# Patient Record
Sex: Female | Born: 1960 | Race: Black or African American | Hispanic: No | Smoking: Current every day smoker
Health system: Southern US, Community
[De-identification: ages and names within clinical notes are randomized; demographics above are authoritative.]

## PROBLEM LIST (undated history)

## (undated) DIAGNOSIS — N939 Abnormal uterine and vaginal bleeding, unspecified: Secondary | ICD-10-CM

## (undated) DIAGNOSIS — M5432 Sciatica, left side: Secondary | ICD-10-CM

## (undated) DIAGNOSIS — M1711 Unilateral primary osteoarthritis, right knee: Secondary | ICD-10-CM

## (undated) DIAGNOSIS — Z9989 Dependence on other enabling machines and devices: Secondary | ICD-10-CM

## (undated) DIAGNOSIS — E119 Type 2 diabetes mellitus without complications: Secondary | ICD-10-CM

## (undated) DIAGNOSIS — M549 Dorsalgia, unspecified: Secondary | ICD-10-CM

## (undated) DIAGNOSIS — E785 Hyperlipidemia, unspecified: Secondary | ICD-10-CM

## (undated) DIAGNOSIS — Z973 Presence of spectacles and contact lenses: Secondary | ICD-10-CM

## (undated) DIAGNOSIS — R569 Unspecified convulsions: Secondary | ICD-10-CM

## (undated) DIAGNOSIS — H43811 Vitreous degeneration, right eye: Secondary | ICD-10-CM

## (undated) DIAGNOSIS — D649 Anemia, unspecified: Secondary | ICD-10-CM

## (undated) DIAGNOSIS — Z972 Presence of dental prosthetic device (complete) (partial): Secondary | ICD-10-CM

## (undated) DIAGNOSIS — M5430 Sciatica, unspecified side: Secondary | ICD-10-CM

## (undated) DIAGNOSIS — G8929 Other chronic pain: Secondary | ICD-10-CM

## (undated) DIAGNOSIS — E669 Obesity, unspecified: Secondary | ICD-10-CM

## (undated) HISTORY — PX: ABDOMINAL HYSTERECTOMY: SHX81

## (undated) HISTORY — DX: Dorsalgia, unspecified: M54.9

## (undated) HISTORY — PX: COLONOSCOPY WITH ESOPHAGOGASTRODUODENOSCOPY (EGD): SHX5779

## (undated) HISTORY — PX: TRIGGER FINGER RELEASE: SHX641

## (undated) HISTORY — DX: Sciatica, unspecified side: M54.30

## (undated) HISTORY — DX: Other chronic pain: G89.29

## (undated) HISTORY — DX: Obesity, unspecified: E66.9

---

## 2018-10-23 ENCOUNTER — Encounter (HOSPITAL_COMMUNITY): Payer: Self-pay | Admitting: Emergency Medicine

## 2018-10-23 ENCOUNTER — Emergency Department (HOSPITAL_COMMUNITY)
Admission: EM | Admit: 2018-10-23 | Discharge: 2018-10-23 | Disposition: A | Discharge status: Home / Self Care | Payer: Medicare Other | Attending: Emergency Medicine | Admitting: Emergency Medicine

## 2018-10-23 DIAGNOSIS — M5442 Lumbago with sciatica, left side: Secondary | ICD-10-CM | POA: Diagnosis not present

## 2018-10-23 DIAGNOSIS — G8929 Other chronic pain: Secondary | ICD-10-CM

## 2018-10-23 DIAGNOSIS — M545 Low back pain: Secondary | ICD-10-CM | POA: Diagnosis present

## 2018-10-23 MED ORDER — PREDNISONE 10 MG (21) PO TBPK: ORAL_TABLET | Freq: Every day | ORAL | 0 refills | Status: DC

## 2018-10-23 MED ORDER — NAPROXEN 500 MG PO TABS: 500.0000 mg | ORAL_TABLET | Freq: Two times a day (BID) | ORAL | 0 refills | Status: DC

## 2018-10-23 MED ORDER — KETOROLAC TROMETHAMINE 30 MG/ML IJ SOLN
60.0000 mg | Freq: Once | INTRAMUSCULAR | Status: AC
  Administered 2018-10-23: 60 mg via INTRAMUSCULAR
  Filled 2018-10-23: qty 2

## 2018-10-23 NOTE — Discharge Instructions (Addendum)
Back Pain:  I have prescribed you an anti-inflammatory medication and a steroid to reduce inflammation.    Prednisone Instructions: Take 6 tabs by mouth daily for 2 days, then 5 tabs for 2 days, then 4 tabs for 2 days, then 3 tabs for 2 days, 2 tabs for 2 days, then 1 tab by mouth daily for 2 days.  Naproxen is a nonsteroidal anti-inflammatory medication that will help with pain and swelling. Be sure to take this medication as prescribed with food, 1 pill every 12 hours,  It should be taken with food, as it can cause stomach upset, and more seriously, stomach bleeding. Do not take other nonsteroidal anti-inflammatory medications with this such as Advil, Motrin, or Aleve.   In addition you may also take Tylenol. Tylenol is generally safe, though you should not take more than 8 of the extra strength (500mg ) pills a day.  The application of heat can help soothe the pain.  Maintaining your daily activities, including walking, is encourged, as it will help you get better faster than just staying in bed.  Low back pain is discomfort in the lower back that may be due to injuries to muscles and ligaments around the spine.  Occasionally, it may be caused by a a problem to a part of the spine called a disc.  The pain may last several days or a few weeks.   Your pain should get better over the next 2 weeks.  You will need to follow up with  Your primary healthcare provider in 1-2 weeks for reassessment, if you do not have a primary care provider one is provided in your discharge instructions. However if you develop severe or worsening pain, low back pain with fever, numbness, weakness, loss of bowel or bladder control, or inability to walk or urinate, you should return to the ER immediately.  Please follow up with your doctor this week for a recheck if still having symptoms.  Emergency Department Resource Guide 1) Find a Doctor and Pay Out of Pocket Although you won't have to find out who is covered by your  insurance plan, it is a good idea to ask around and get recommendations. You will then need to call the office and see if the doctor you have chosen will accept you as a new patient and what types of options they offer for patients who are self-pay. Some doctors offer discounts or will set up payment plans for their patients who do not have insurance, but you will need to ask so you aren't surprised when you get to your appointment.  2) Contact Your Local Health Department Not all health departments have doctors that can see patients for sick visits, but many do, so it is worth a call to see if yours does. If you don't know where your local health department is, you can check in your phone book. The CDC also has a tool to help you locate your state's health department, and many state websites also have listings of all of their local health departments.  3) Find a Walk-in Clinic If your illness is not likely to be very severe or complicated, you may want to try a walk in clinic. These are popping up all over the country in pharmacies, drugstores, and shopping centers. They're usually staffed by nurse practitioners or physician assistants that have been trained to treat common illnesses and complaints. They're usually fairly quick and inexpensive. However, if you have serious medical issues or chronic medical problems, these  are probably not your best option.  No Primary Care Doctor: Call Health Connect at  [phone] - they can help you locate a primary care doctor that  accepts your insurance, provides certain services, etc. Physician Referral [phone]  Emergency Department Resource Guide 1) Find a Doctor and Pay Out of Pocket Although you won't have to find out who is covered by your insurance plan, it is a good idea to ask around and get recommendations. You will then need to call the office and see if the doctor you have chosen will accept you as a new patient and what types of options  they offer for patients who are self-pay. Some doctors offer discounts or will set up payment plans for their patients who do not have insurance, but you will need to ask so you aren't surprised when you get to your appointment.  2) Contact Your Local Health Department Not all health departments have doctors that can see patients for sick visits, but many do, so it is worth a call to see if yours does. If you don't know where your local health department is, you can check in your phone book. The CDC also has a tool to help you locate your state's health department, and many state websites also have listings of all of their local health departments.  3) Find a Walk-in Clinic If your illness is not likely to be very severe or complicated, you may want to try a walk in clinic. These are popping up all over the country in pharmacies, drugstores, and shopping centers. They're usually staffed by nurse practitioners or physician assistants that have been trained to treat common illnesses and complaints. They're usually fairly quick and inexpensive. However, if you have serious medical issues or chronic medical problems, these are probably not your best option.  No Primary Care Doctor: Call Health Connect at  [phone] - they can help you locate a primary care doctor that  accepts your insurance, provides certain services, etc. Physician Referral Service- [phone]  Chronic Pain Problems: Organization         Address  Phone   Notes  [facility]  [phone]) [phone] Patients need to be referred by their primary care doctor.   Medication Assistance: Organization         Address  Phone   Notes  [facility] [address] [place], [place] [address] [phone]) [phone] --Must be a resident of [place] -- Must have NO insurance coverage whatsoever (no Medicaid/ Medicare, etc.) -- The pt. MUST have a primary care doctor that directs their care  regularly and follows them in the community   [facility]  [phone]   [employer]  [phone]    Agencies that provide inexpensive medical care: Organization         Address  Phone   Notes  [facility]  [phone]   [facility]    [phone]   [facility] [address] [place], [place] [address] [phone]   [facility] [address], [place] [phone]   [facility]    [phone]   [facility]    [phone]) [phone]   [facility]  [address], [place] Phone:  [phone], Fax:  [phone] Hours of Operation:  9 am - 6 pm, M-F.  Also accepts Medicaid/Medicare and self-pay.  [facility]  [address], [place] Phone: [phone], Fax: [phone]. Hours of Operation:  8:30 am - 5:30 pm, M-F.  Also accepts Medicaid and self-pay.  [facility] [place] [address] [place] Phone: [phone]   [facility] [address] Natasha Bence [place], [place] [phone], [phone] Mondays & Thursdays: 7-9 AM.  First 15 patients are seen on a first come, first serve basis.    Medicaid-accepting [facility] Providers:  Organization         Address  Phone   Notes  [facility] [address], [place] [phone] Also accepts self-pay patients.  [facility] [address] Laurell Josephs [place], [place]  [phone]   [facility] [address], [place] [phone]   [facility] [address], [place] [phone]   Renaye Rakers [address], [place]   [phone] Only accepts [place] patients after they have their name applied to their card.   Self-Pay (no insurance) in [facility]:  Organization         Address  Phone    Notes  Sickle Cell Patients, [facility] [address] [place], [place] [phone]   [facility] [address] [place], [place] [phone]   [facility]  [address],  [phone]   [facility]/Dr. Osei-Bonsu  [address] [place] or [address], [place] [phone] Phone number for both [place] and [place] locations is the same.  [facility] [address] [place] [phone]   [facility] [address], [place] or [address] [phone]   [facility] [address], [place] [phone], phone; [phone], fax Sees patients 1st and 3rd Saturday of every month.  Must not qualify for public or private insurance (i.e. Medicaid, Medicare, [facility] [place] [facility] Choice, Veterans' Benefits)  Household income should be no more than 200% of the poverty level The clinic cannot treat you if you are pregnant or think you are pregnant  Sexually transmitted diseases are not treated at the clinic.

## 2018-10-23 NOTE — ED Triage Notes (Signed)
Patient here from home with lower back pain. Reports that [place] from [place] and is waiting on Medicaid. Hx of same. States that she has prior MRI results.

## 2018-10-23 NOTE — ED Provider Notes (Signed)
[facility] Provider Note   CSN: [id number] Arrival date & time: 10/23/18  1222     History   Chief Complaint Chief Complaint  Patient presents with  . Back Pain    HPI Amy SandiferJoyce Mullins is a 57 y.o. female presenting for constant left low back pain onset 2 years ago. Patient reports pain radiates down her left leg. Patient states she has taken Percocet with partial relief, but she ran out 1 month ago. Patient also states steroid shots have helped her in the past. Patient describes she seen pain management in [place], but she recently moved her and she is waiting for her Medicaid to transfer. Patient denies any trauma. Patient denies tingling, weakness, incontinence to bowel/bladder, fever, chills, IV drug use, or hx of cancer. Patient reports numbness on left toes intermittently. Patient denies any medical problems. Patient reports she drove to the ER today, but she is unable to walk at this time.    HPI  History reviewed. No pertinent past medical history.  There are no active problems to display for this patient.   History reviewed. No pertinent surgical history.   OB History   None      Home Medications    Prior to Admission medications   Medication Sig Start Date End Date Taking? Authorizing Provider  ibuprofen (ADVIL,MOTRIN) 200 MG tablet Take 16,000 mg by mouth every 6 (six) hours as needed for moderate pain.   Yes [provider]  oxyCODONE-acetaminophen (PERCOCET) 10-325 MG tablet Take 1 tablet by mouth every 4 (four) hours as needed for pain.   Yes [provider]  traMADol (ULTRAM) 50 MG tablet Take 50 mg by mouth every 6 (six) hours as needed for moderate pain.   Yes [provider]  naproxen (NAPROSYN) 500 MG tablet Take 1 tablet (500 mg total) by mouth 2 (two) times daily. 10/23/18   Carlyle BasquesHernandez, Mykela Mewborn P, PA-C  predniSONE (STERAPRED UNI-PAK 21 TAB) 10 MG (21) TBPK tablet Take by mouth daily. Take 6 tabs  by mouth daily for 2 days, then 5 tabs for 2 days, then 4 tabs for 2 days, then 3 tabs for 2 days, 2 tabs for 2 days, then 1 tab by mouth daily for 2 days. 10/23/18   Leretha DykesHernandez, Boe Deans P, PA-C    Family History No family history on file.  Social History Social History   Tobacco Use  . Smoking status: Never Smoker  . Smokeless tobacco: Never Used  Substance Use Topics  . Alcohol use: Never    Frequency: Never  . Drug use: Never     Allergies   Patient has no known allergies.   Review of Systems Review of Systems  Constitutional: Negative for activity change, chills, diaphoresis, fever and unexpected weight change.  Respiratory: Negative for cough and shortness of breath.   Cardiovascular: Negative for chest pain, palpitations and leg swelling.  Gastrointestinal: Negative for abdominal pain, constipation, diarrhea, nausea and vomiting.  Genitourinary: Negative for difficulty urinating, dysuria, flank pain and hematuria.  Musculoskeletal: Positive for back pain and gait problem. Negative for arthralgias, joint swelling, myalgias, neck pain and neck stiffness.  Skin: Negative for rash.  Allergic/Immunologic: Negative for immunocompromised state.  Neurological: Negative for dizziness, syncope, weakness and numbness.  Hematological: Does not bruise/bleed easily.     Physical Exam Updated Vital Signs BP (!) 162/116   Pulse 98   Temp 98 F (36.7 C)   SpO2 96%   Physical Exam  Constitutional: She is  oriented to person, place, and time. She appears well-developed and well-nourished. No distress.  HENT:  Head: Normocephalic and atraumatic.  Neck: Normal range of motion. Neck supple.  Cardiovascular: Normal rate, regular rhythm, normal heart sounds and intact distal pulses. Exam reveals no gallop and no friction rub.  No murmur heard. Pulmonary/Chest: Effort normal and breath sounds normal. No respiratory distress.  Musculoskeletal: She exhibits no edema, tenderness or  deformity.       Thoracic back: Normal. She exhibits normal range of motion, no tenderness, no bony tenderness, no swelling and no edema.       Lumbar back: She exhibits decreased range of motion. She exhibits no tenderness, no bony tenderness, no swelling and no edema.  Negative straight leg test. Patient has limited ROM with flexion and extension of her lumbar spine due to the pain.   Neurological: She is alert and oriented to person, place, and time.  Skin: No rash noted. She is not diaphoretic. No erythema. No pallor.  Psychiatric: She has a normal mood and affect.  Nursing note and vitals reviewed.    ED Treatments / Results  Labs (all labs ordered are listed, but only abnormal results are displayed) Labs Reviewed - No data to display  EKG None  Radiology No results found.  Procedures Procedures (including critical care time)  Medications Ordered in ED Medications  ketorolac (TORADOL) 30 MG/ML injection 60 mg (60 mg Intramuscular Given 10/23/18 1350)     Initial Impression / Assessment and Plan / ED Course  I have reviewed the triage vital signs and the nursing notes.  Pertinent labs & imaging results that were available during my care of the patient were reviewed by me and considered in my medical decision making (see chart for details).    Patient presents with complaint of back pain. Patient nontoxic appearing, in no apparent distress, vitals WNL, stable.   Imaging: Patient brought MRI disk from 08/2018 with her today. We were unable to access this image. Patient denies any recent trauma or acute changes in her chronic back pain. Suspect imaging is not necessary at this time.   Therapeutics: Provided Toradol for the back pain.   Assessment/Plan: Patient presents with complaint of back pain.  Patient is nontoxic appearing, vitals are WNL. Patient has normal neurologic exam, no midline tenderness to palpation. He is ambulatory in the ED.  No back pain red flags. No  urinary sxs. Most likely muscle strain versus spasm. Considered disc disease, UTI/pyelonephritis, kidney stone, aortic aneurysm/dissection, cauda equina or epidural abscess however these do not fit clinical picture at this time. Will treat with Naproxen and a prednisone taper. I discussed treatment plan, need for PCP follow-up, and return precautions with the patient. Provided opportunity for questions, patient confirmed understanding and is in agreement with plan.    Final Clinical Impressions(s) / ED Diagnoses   Final diagnoses:  Chronic left-sided low back pain with left-sided sciatica    ED Discharge Orders         Ordered    naproxen (NAPROSYN) 500 MG tablet  2 times daily     10/23/18 1404    predniSONE (STERAPRED UNI-PAK 21 TAB) 10 MG (21) TBPK tablet  Daily     10/23/18 1404           Leretha Dykes, [place] 10/23/18 1405    Gerhard Munch, MD 10/23/18 1502

## 2018-11-15 ENCOUNTER — Emergency Department (HOSPITAL_COMMUNITY): Payer: Medicare Other

## 2018-11-15 ENCOUNTER — Encounter (HOSPITAL_COMMUNITY): Payer: Self-pay

## 2018-11-15 ENCOUNTER — Other Ambulatory Visit: Payer: Self-pay

## 2018-11-15 ENCOUNTER — Emergency Department (HOSPITAL_COMMUNITY)
Admission: EM | Admit: 2018-11-15 | Discharge: 2018-11-15 | Disposition: A | Discharge status: Home / Self Care | Payer: Medicare Other | Attending: Emergency Medicine | Admitting: Emergency Medicine

## 2018-11-15 DIAGNOSIS — Y9389 Activity, other specified: Secondary | ICD-10-CM | POA: Insufficient documentation

## 2018-11-15 DIAGNOSIS — W19XXXA Unspecified fall, initial encounter: Secondary | ICD-10-CM

## 2018-11-15 DIAGNOSIS — S8991XA Unspecified injury of right lower leg, initial encounter: Secondary | ICD-10-CM | POA: Insufficient documentation

## 2018-11-15 DIAGNOSIS — Y9289 Other specified places as the place of occurrence of the external cause: Secondary | ICD-10-CM | POA: Diagnosis not present

## 2018-11-15 DIAGNOSIS — Z79899 Other long term (current) drug therapy: Secondary | ICD-10-CM | POA: Insufficient documentation

## 2018-11-15 DIAGNOSIS — X500XXA Overexertion from strenuous movement or load, initial encounter: Secondary | ICD-10-CM | POA: Insufficient documentation

## 2018-11-15 DIAGNOSIS — Y998 Other external cause status: Secondary | ICD-10-CM | POA: Diagnosis not present

## 2018-11-15 DIAGNOSIS — M25561 Pain in right knee: Secondary | ICD-10-CM

## 2018-11-15 MED ORDER — NAPROXEN 375 MG PO TABS: 375.0000 mg | ORAL_TABLET | Freq: Two times a day (BID) | ORAL | 0 refills | Status: DC

## 2018-11-15 NOTE — ED Provider Notes (Addendum)
[facility] Provider Note   CSN: [id number] Arrival date & time: 11/15/18  1531     History   Chief Complaint Chief Complaint  Patient presents with  . Fall  . Knee Pain    HPI Amy Mullins is a 57 y.o. female with a history of chronic back pain who presents to the emergency department with complaints of right knee pain for the past 2 days.  Patient states she is having pain to the right knee, primarily with range of motion, no specific injury or activity that initiated pain.  She states that today she was ambulating, stepped down with the RLE, and felt a crack/pop sensation to the knee she has been having pain in, and she ultimately fell to the ground.  She denies head injury or loss of consciousness.  She states that is unable to get up on her own and therefore EMS was called.  She received 100 mcg of fentanyl in route with minimal improvement.  States at rest it does not hurt that bad, it is just when she moves it, w/ movement 10/10.  She denies any other areas of injury.  She denies numbness, tingling, weakness, fever, chills, change in color, or significant swelling.  No prior surgical procedures to the right lower extremity.  HPI  History reviewed. No pertinent past medical history.  There are no active problems to display for this patient.   History reviewed. No pertinent surgical history.   OB History   None      Home Medications    Prior to Admission medications   Medication Sig Start Date End Date Taking? Authorizing Provider  gabapentin (NEURONTIN) 300 MG capsule Take 300 mg by mouth every 8 (eight) hours.   Yes [provider]  oxyCODONE-acetaminophen (PERCOCET) 10-325 MG tablet Take 1 tablet by mouth every 4 (four) hours as needed for pain.   Yes [provider]  predniSONE (STERAPRED UNI-PAK 21 TAB) 10 MG (21) TBPK tablet Take by mouth daily. Take 6 tabs by mouth daily for 2 days, then 5 tabs for 2 days, then  4 tabs for 2 days, then 3 tabs for 2 days, 2 tabs for 2 days, then 1 tab by mouth daily for 2 days. 10/23/18  Yes Hernandez, Ana P, PA-C  traMADol (ULTRAM) 50 MG tablet Take 50 mg by mouth every 6 (six) hours as needed for moderate pain.   Yes [provider]  naproxen (NAPROSYN) 500 MG tablet Take 1 tablet (500 mg total) by mouth 2 (two) times daily. 10/23/18   Leretha Dykes, PA-C    Family History No family history on file.  Social History Social History   Tobacco Use  . Smoking status: Never Smoker  . Smokeless tobacco: Never Used  Substance Use Topics  . Alcohol use: Never    Frequency: Never  . Drug use: Never     Allergies   Patient has no known allergies.   Review of Systems Review of Systems  Constitutional: Negative for chills and fever.  Musculoskeletal: Positive for arthralgias (R knee).  Neurological: Negative for syncope, weakness and numbness.     Physical Exam Updated Vital Signs BP 122/65 (BP Location: Left Arm)   Pulse (!) 57   Temp 98.1 F (36.7 C)   Resp 16   Ht 5\' 7"  (1.702 m)   Wt (!) 163.3 kg   SpO2 97%   BMI 56.38 kg/m   Physical Exam  Constitutional: She appears well-developed and  well-nourished. No distress.  HENT:  Head: Normocephalic and atraumatic.  Eyes: Conjunctivae are normal. Right eye exhibits no discharge. Left eye exhibits no discharge.  Cardiovascular:  2+ DP/PT pulses.   Musculoskeletal:  No obvious deformity, appreciable swelling, erythema, ecchymosis, or warmth. Back: No midline tenderness or palpable step-off. Upper extremities: Normal range of motion.  Nontender Lower extremities: Patient with fairly normal range of motion at the hips and the knees bilaterally.  She has normal range of motion of the left knee.  Right knee flexion is limited secondary to pain, she is able to fully extend and flex to about 90 degrees.  The knee does not have any point/focal bony tenderness.  Lower extremities are otherwise  nontender.  Special test difficult to perform secondary to body habitus.  Neurological: She is alert.  Clear speech.   Psychiatric: She has a normal mood and affect. Her behavior is normal. Thought content normal.  Nursing note and vitals reviewed.    ED Treatments / Results  Labs (all labs ordered are listed, but only abnormal results are displayed) Labs Reviewed - No data to display  EKG None  Radiology No results found.  Procedures Procedures (including critical care time)  SPLINT APPLICATION Date/Time: 6:26 PM Authorized by: Harvie HeckSamantha Petrucelli Consent: Verbal consent obtained. Risks and benefits: risks, benefits and alternatives were discussed Consent given by: patient Splint applied by: orthopedic technician Location details: RLE Splint type: knee immobilizer Post-procedure: The splinted body part was neurovascularly unchanged following the procedure. Patient tolerance: Patient tolerated the procedure well with no immediate complications.  Medications Ordered in ED Medications - No data to display   Initial Impression / Assessment and Plan / ED Course  I have reviewed the triage vital signs and the nursing notes.  Pertinent labs & imaging results that were available during my care of the patient were reviewed by me and considered in my medical decision making (see chart for details).   Patient presents w/ R knee pain x 2 days, worsened today with fall w/ cracking sensation to the knee.  Nontoxic-appearing, no apparent distress, vitals without significant abnormality.  No evidence of serious head, neck, or back injury.  Right knee without obvious deformity or open wounds. No fever, erythema, or warmth to suggest septic joint. Range of motion mildly limited with flexion, however able to flex to 90 degrees.  X-ray obtained negative for fracture or dislocation, there are arthritic type changes.  Patient neurovascularly intact distally.  Will place in knee immobilizer and  provide short course of NSAIDs.  Recommended PRICE and orthopedics follow-up. I discussed results, treatment plan, need for follow-up, and return precautions with the patient. Provided opportunity for questions, patient confirmed understanding and is in agreement with plan.   Final Clinical Impressions(s) / ED Diagnoses   Final diagnoses:  Fall, initial encounter  Acute pain of right knee    ED Discharge Orders         Ordered    naproxen (NAPROSYN) 375 MG tablet  2 times daily     11/15/18 [address]25           Petrucelli, Pleas KochSamantha R, PA-C 11/15/18 1826    Cherly Andersonetrucelli, Samantha R, PA-C 11/15/18 1827    Shaune PollackIsaacs, [place], MD 11/16/18 1500

## 2018-11-15 NOTE — ED Triage Notes (Signed)
Per EMS: Pt from home.  Pt c/o of of R knee pain past several days.  Pt was walking outside, then her R knee popped and "gave out" causing her to fall.  100 mcg fentanyl given.

## 2018-11-15 NOTE — Discharge Instructions (Addendum)
Please read and follow all provided instructions.  You have been seen today for a fall with knee pain.   Tests performed today include: An x-ray of the affected area - does NOT show any broken bones or dislocations. There are some arthritis type changes in your knee.  Vital signs. See below for your results today.   Home care instructions: -- *PRICE in the first 24-48 hours after injury: Protect (with brace, splint, sling), if given by your provider Rest Ice- Do not apply ice pack directly to your skin, place towel or similar between your skin and ice/ice pack. Apply ice for 20 min, then remove for 40 min while awake Compression- Wear brace, elastic bandage, splint as directed by your provider Elevate affected extremity above the level of your heart when not walking around for the first 24-48 hours   Medications:  - Naproxen is a nonsteroidal anti-inflammatory medication that will help with pain and swelling. Be sure to take this medication as prescribed with food, 1 pill every 12 hours,  It should be taken with food, as it can cause stomach upset, and more seriously, stomach bleeding. Do not take other nonsteroidal anti-inflammatory medications with this such as Advil, Motrin, Aleve, Mobic, Goodie Powder, or Motrin.    You make take Tylenol per over the counter dosing with these medications.   We have prescribed you new medication(s) today. Discuss the medications prescribed today with your pharmacist as they can have adverse effects and interactions with your other medicines including over the counter and prescribed medications. Seek medical evaluation if you start to experience new or abnormal symptoms after taking one of these medicines, seek care immediately if you start to experience difficulty breathing, feeling of your throat closing, facial swelling, or rash as these could be indications of a more serious allergic reaction   Follow-up instructions: Please follow-up with your primary  care provider or the provided orthopedic physician (bone specialist) if you continue to have significant pain in 1 week. In this case you may have a more severe injury that requires further care.   Return instructions:  Please return if your digits or extremity are numb or tingling, appear gray or blue, or you have severe pain (also elevate the extremity and loosen splint or wrap if you were given one) Please return if you have redness or fevers.  Please return to the Emergency Department if you experience worsening symptoms.  Please return if you have any other emergent concerns. Additional Information:  Your vital signs today were: BP 122/65 (BP Location: Left Arm)    Pulse (!) 57    Temp 98.1 F (36.7 C)    Resp 16    Ht 5\' 7"  (1.702 m)    Wt (!) 163.3 kg    SpO2 97%    BMI 56.38 kg/m  If your blood pressure (BP) was elevated above 135/85 this visit, please have this repeated by your doctor within one month. ---------------

## 2018-11-23 ENCOUNTER — Encounter (INDEPENDENT_AMBULATORY_CARE_PROVIDER_SITE_OTHER): Payer: Self-pay | Admitting: Orthopedic Surgery

## 2018-11-23 ENCOUNTER — Ambulatory Visit (INDEPENDENT_AMBULATORY_CARE_PROVIDER_SITE_OTHER): Payer: Medicare Other | Admitting: Orthopedic Surgery

## 2018-11-23 DIAGNOSIS — M1711 Unilateral primary osteoarthritis, right knee: Secondary | ICD-10-CM

## 2018-11-28 ENCOUNTER — Encounter (INDEPENDENT_AMBULATORY_CARE_PROVIDER_SITE_OTHER): Payer: Self-pay | Admitting: Orthopedic Surgery

## 2018-11-28 DIAGNOSIS — M1711 Unilateral primary osteoarthritis, right knee: Secondary | ICD-10-CM

## 2018-11-28 MED ORDER — METHYLPREDNISOLONE ACETATE 40 MG/ML IJ SUSP
40.0000 mg | INTRAMUSCULAR | Status: AC | PRN
  Administered 2018-11-28: 40 mg via INTRA_ARTICULAR

## 2018-11-28 MED ORDER — BUPIVACAINE HCL 0.25 % IJ SOLN
4.0000 mL | INTRAMUSCULAR | Status: AC | PRN
  Administered 2018-11-28: 4 mL via INTRA_ARTICULAR

## 2018-11-28 MED ORDER — LIDOCAINE HCL 1 % IJ SOLN
5.0000 mL | INTRAMUSCULAR | Status: AC | PRN
  Administered 2018-11-28: 5 mL

## 2018-11-28 NOTE — Progress Notes (Signed)
Office Visit Note   Patient: Amy Mullins           Date of Birth: 05-Feb-1961           MRN: [id number] Visit Date: 11/23/2018 Requested by: No referring provider defined for this encounter. PCP: Patient, No Pcp Per  Subjective: Chief Complaint  Patient presents with  . Right Knee - Pain    HPI: Amy Mullins is a patient with right knee pain.  She had a fall 2012 919.  Radiographs in the system show mild arthritis with spurring.  She stepped down she heard and felt a crunch in the right knee.  She is given a brace but she is not wearing it.  Hard for her to walk.  Reports anterior knee pain inferior to the patella.  She is been using Advil and icy hot as well.              ROS: All systems reviewed are negative as they relate to the chief complaint within the history of present illness.  Patient denies  fevers or chills.   Assessment & Plan: Visit Diagnoses:  1. Unilateral primary osteoarthritis, right knee     Plan: Impression is right knee pain with no acute fracture.  May have a meniscal root avulsion type injury or meniscal tear.  She does have mild effusion in the knee.  That is aspirated today and it is not bloody.  I like to see her back in 4 weeks so we can decide for or against further imaging.  I think the aspiration and injection will help her get over the acute nature of the pain.  No evidence of ligament instability at this time.  Follow-Up Instructions: Return in about 4 weeks (around 12/21/2018).   Orders:  No orders of the defined types were placed in this encounter.  No orders of the defined types were placed in this encounter.     Procedures: Large Joint Inj: R knee on 11/28/2018 10:05 AM Indications: diagnostic evaluation, joint swelling and pain Details: 18 G 1.5 in needle, superolateral approach  Arthrogram: No  Medications: 5 mL lidocaine 1 %; 40 mg methylPREDNISolone acetate 40 MG/ML; 4 mL bupivacaine 0.25 % Outcome: tolerated well, no immediate  complications Procedure, treatment alternatives, risks and benefits explained, specific risks discussed. Consent was given by the patient. Immediately prior to procedure a time out was called to verify the correct patient, procedure, equipment, support staff and site/side marked as required. Patient was prepped and draped in the usual sterile fashion.       Clinical Data: No additional findings.  Objective: Vital Signs: There were no vitals taken for this visit.  Physical Exam:   Constitutional: Patient appears well-developed HEENT:  Head: Normocephalic Eyes:EOM are normal Neck: Normal range of motion Cardiovascular: Normal rate Pulmonary/chest: Effort normal Neurologic: Patient is alert Skin: Skin is warm Psychiatric: Patient has normal mood and affect    Ortho Exam: Ortho exam demonstrates full active and passive range of motion of the left knee.  On the right-hand side patient does have some anterior knee pain.  Extensor mechanism is intact.  Collateral cruciate ligaments are stable.  Patient has palpable pedal pulses and no groin pain with internal or external rotation of the leg.  No other masses lymphadenopathy or skin changes noted in that right knee region.  Specialty Comments:  No specialty comments available.  Imaging: No results found.   PMFS History: There are no active problems to display for this patient.  History reviewed. No pertinent past medical history.  History reviewed. No pertinent family history.  History reviewed. No pertinent surgical history. Social History   Occupational History  . Not on file  Tobacco Use  . Smoking status: Never Smoker  . Smokeless tobacco: Never Used  Substance and Sexual Activity  . Alcohol use: Never    Frequency: Never  . Drug use: Never  . Sexual activity: Never

## 2018-12-22 ENCOUNTER — Encounter (INDEPENDENT_AMBULATORY_CARE_PROVIDER_SITE_OTHER): Payer: Self-pay | Admitting: Orthopedic Surgery

## 2018-12-22 ENCOUNTER — Ambulatory Visit (INDEPENDENT_AMBULATORY_CARE_PROVIDER_SITE_OTHER): Payer: Medicare Other | Admitting: Orthopedic Surgery

## 2018-12-22 DIAGNOSIS — M545 Low back pain, unspecified: Secondary | ICD-10-CM

## 2018-12-24 ENCOUNTER — Encounter (INDEPENDENT_AMBULATORY_CARE_PROVIDER_SITE_OTHER): Payer: Self-pay | Admitting: Orthopedic Surgery

## 2018-12-24 NOTE — Progress Notes (Signed)
Office Visit Note   Patient: Amy Mullins           Date of Birth: 1961/03/18           MRN: [id number] Visit Date: 12/22/2018 Requested by: No referring provider defined for this encounter. PCP: Patient, No Pcp Per  Subjective: Chief Complaint  Patient presents with  . Right Knee - Follow-up    Had injection on 11/23/2018 and it has helped her greatly    HPI: Amy Mullins is a patient with right knee pain.  Had injection 11/23/2018 which helped her.  She states now that her right leg and sciatic is "killing her".  Does have a history of injections in the past.  She was in pain management and getting injections with Amy Mullins my in another state.  Release form was signed today.  She was recommended to have surgery.  Last MRI scan was in August.  We are obtaining those records.  She does want to work.  She is had multiple injections and a long history of back pain and radicular pain.              ROS: All systems reviewed are negative as they relate to the chief complaint within the history of present illness.  Patient denies  fevers or chills.   Assessment & Plan: Visit Diagnoses:  1. Low back pain, unspecified back pain laterality, unspecified chronicity, unspecified whether sciatica present     Plan: Impression is low back pain with right-sided sciatica and history of left-sided sciatica.  She has history of injections and was seeing a pain management specialist prior to moving down to [place].  All those records have been requested.  Plan is for her to get the MRI report and scan and follow-up with Amy Mullins for epidural steroid injection and possible referral to spine surgeon for further surgical management.  I will see her back as needed.  Follow-Up Instructions: No follow-ups on file.   Orders:  Orders Placed This Encounter  Procedures  . Ambulatory referral to Physical Medicine Rehab   No orders of the defined types were placed in this encounter.      Procedures: No procedures performed   Clinical Data: No additional findings.  Objective: Vital Signs: There were no vitals taken for this visit.  Physical Exam:   Constitutional: Patient appears well-developed HEENT:  Head: Normocephalic Eyes:EOM are normal Neck: Normal range of motion Cardiovascular: Normal rate Pulmonary/chest: Effort normal Neurologic: Patient is alert Skin: Skin is warm Psychiatric: Patient has normal mood and affect    Ortho Exam: Ortho exam demonstrates increased body mass index.  She has no nerve root tension signs today no definite paresthesias.  She has palpable pedal pulses but pain with forward and lateral bending.  Right knee has diminished pain with active and passive flexion and no effusion.  Reflexes symmetric bilateral patella and Achilles.  Specialty Comments:  No specialty comments available.  Imaging: No results found.   PMFS History: There are no active problems to display for this patient.  History reviewed. No pertinent past medical history.  History reviewed. No pertinent family history.  History reviewed. No pertinent surgical history. Social History   Occupational History  . Not on file  Tobacco Use  . Smoking status: Never Smoker  . Smokeless tobacco: Never Used  Substance and Sexual Activity  . Alcohol use: Never    Frequency: Never  . Drug use: Never  . Sexual activity: Never

## 2019-01-06 ENCOUNTER — Encounter (INDEPENDENT_AMBULATORY_CARE_PROVIDER_SITE_OTHER): Payer: Self-pay | Admitting: Physical Medicine and Rehabilitation

## 2019-01-06 ENCOUNTER — Ambulatory Visit (INDEPENDENT_AMBULATORY_CARE_PROVIDER_SITE_OTHER): Payer: Medicare Other | Admitting: Physical Medicine and Rehabilitation

## 2019-01-06 DIAGNOSIS — M5442 Lumbago with sciatica, left side: Secondary | ICD-10-CM | POA: Diagnosis not present

## 2019-01-06 DIAGNOSIS — M5116 Intervertebral disc disorders with radiculopathy, lumbar region: Secondary | ICD-10-CM | POA: Diagnosis not present

## 2019-01-06 DIAGNOSIS — G8929 Other chronic pain: Secondary | ICD-10-CM

## 2019-01-06 DIAGNOSIS — M47816 Spondylosis without myelopathy or radiculopathy, lumbar region: Secondary | ICD-10-CM | POA: Diagnosis not present

## 2019-01-06 DIAGNOSIS — M5416 Radiculopathy, lumbar region: Secondary | ICD-10-CM | POA: Diagnosis not present

## 2019-01-06 DIAGNOSIS — G894 Chronic pain syndrome: Secondary | ICD-10-CM

## 2019-01-06 NOTE — Progress Notes (Signed)
   Numeric Pain Rating Scale and Functional Assessment Average Pain 8 Pain Right Now 6 My pain is constant Pain is worse with: walking and sitting.  Pain improves with: nothing provides relief.   In the last MONTH (on 0-10 scale) has pain interfered with the following?  1. General activity like being  able to carry out your everyday physical activities such as walking, climbing stairs, carrying groceries, or moving a chair?  Rating(9)  2. Relation with others like being able to carry out your usual social activities and roles such as  activities at home, at work and in your community. Rating(10)  3. Enjoyment of life such that you have  been bothered by emotional problems such as feeling anxious, depressed or irritable?  Rating(10)

## 2019-01-07 ENCOUNTER — Encounter (INDEPENDENT_AMBULATORY_CARE_PROVIDER_SITE_OTHER): Payer: Self-pay | Admitting: Physical Medicine and Rehabilitation

## 2019-01-07 NOTE — Progress Notes (Signed)
Amy Mullins - 58 y.o. female MRN [id number]  Date of birth: 1960-12-19  Office Visit Note: Visit Date: 01/06/2019 PCP: Patient, No Pcp Per Referred by: Cammy Copa, MD  Subjective: Chief Complaint  Patient presents with  . Lower Back - Pain  . Left Hip - Pain  . Left Leg - Pain  . Left Foot - Numbness   HPI: Jru Bellmore is a 58 y.o. female who comes in today At the request of Dr. Burnard Bunting for evaluation management of low back pain with potential for interventional spine procedure.  Patient moved from [place] in October 2019.  She has a history of chronic pain management with medication uses including gabapentin and tizanidine anti-inflammatory and Percocet.  She reports not having any pain medication since she has been here from [place].  In point of fact she actually did have some tramadol in December as this was reviewed on the controlled substance list.  She was seeing a Dr. Marcelle Smiling and Dr. August Saucer did request those records but we have not seen those as of yet.  The patient did bring in lumbar MRI that was completed in September 2019.  This is reviewed with the patient with images and is reviewed below as a dictated report from what I saw on the images.  Report was not available.  Patient did have significant facet arthritis on the left at L5-S1 with gaping facet joint and left paracentral herniated disc.  In terms of her pain she is having lower back pain that radiates into the left buttock region and posterior lateral side of the left hip down to the posterior knee with paresthesia.  No right-sided complaints.  Dr. August Saucer has been following her for her knee.  Medications at this point have not helped.  She does get some tingling in the lateral toes.  Pain is pretty consistent with an S1 type of radicular pain coupled with may be facet joint pain.  She reports injections with the doctor in [place] without much relief.  She had many injections over several years with no relief  but she states that was a lot of those were before the radicular type pain.  She does report one injection that was performed more in the buttock region seem to help for about a month.  She denies any bowel or bladder symptoms or fevers chills or night sweats or night pain.  She has had no unintended weight loss.  Her case is complicated by morbid obesity as she weighs close to 360 pounds.  She is also had complications with trying to get a primary care physician in town as she has been trying to get Medicare and Medicaid transferred from [place].  She denies any specific injury.  Review of Systems  Constitutional: Negative for chills, fever, malaise/fatigue and weight loss.  HENT: Negative for hearing loss and sinus pain.   Eyes: Negative for blurred vision, double vision and photophobia.  Respiratory: Negative for cough and shortness of breath.   Cardiovascular: Negative for chest pain, palpitations and leg swelling.  Gastrointestinal: Negative for abdominal pain, nausea and vomiting.  Genitourinary: Negative for flank pain.  Musculoskeletal: Positive for back pain and joint pain. Negative for myalgias.       Left buttock and leg pain  Skin: Negative for itching and rash.  Neurological: Positive for tingling. Negative for tremors, focal weakness and weakness.  Endo/Heme/Allergies: Negative.   Psychiatric/Behavioral: Negative for depression.  All other systems reviewed and are negative.  Otherwise per HPI.  Assessment & Plan: Visit Diagnoses:  1. Lumbar radiculopathy   2. Radiculopathy due to lumbar intervertebral disc disorder   3. Spondylosis without myelopathy or radiculopathy, lumbar region   4. Chronic left-sided low back pain with left-sided sciatica   5. Chronic pain syndrome   6. Morbidly obese (HCC)     Plan: Findings:  Chronic long-term history of chronic pain syndrome but 2-year history of radicular complaints consistent with an S1 radicular pain.  MRI evidence of herniated  disc at L5-S1 along with significant facet arthropathy on the left at L5-S1.  Prior history of injections that were not successful but we do not know exactly which injections were performed or how they were performed.  She reports pain relief with Percocet in the past through her pain management physician in [place].  I think the best I have to offer is she has consistent pain with herniated disc and lumbar epidural injection which would be an S1 transforaminal injection would probably benefit her or benefit most people in the same condition.  She would be difficult because of her body habitus but I do think we could perform that injection we talked about this at length.  She does want to proceed.  In terms of pain management we are not a pain management facility here.  I have given her names of primary care physicians [facility] that she can contact.  When she is established with them they may want to refer her to a chronic pain management facility but hopefully we can get her leg pain better with injection.    Meds & Orders: No orders of the defined types were placed in this encounter.  No orders of the defined types were placed in this encounter.   Follow-up: Return for Left S1 transforaminal epidural steroid injection.   Procedures: No procedures performed  No notes on file   Clinical History: Lumbar spine MRI dated September 2019  Images showed normal anatomic alignment without scoliosis or listhesis.  There is normal lordotic curve and fairly well hydrated disc except for the L5-S1 region which shows left paracentral disc herniation along with significant facet arthritis on the left with gaping facet joint.  There is small central protrusion at L4-5 without compression.   She reports that she has never smoked. She has never used smokeless tobacco. No results for input(s): HGBA1C, LABURIC in the last 8760 hours.  Objective:  VS:  HT:    WT:   BMI:     BP:   HR: bpm  TEMP: ( )   RESP:  Physical Exam Vitals signs and nursing note reviewed.  Constitutional:      General: She is not in acute distress.    Appearance: Normal appearance. She is well-developed. She is obese.  HENT:     Head: Normocephalic and atraumatic.     Nose: Nose normal.     Mouth/Throat:     Mouth: Mucous membranes are moist.     Pharynx: Oropharynx is clear.  Eyes:     Conjunctiva/sclera: Conjunctivae normal.     Pupils: Pupils are equal, round, and reactive to light.  Neck:     Musculoskeletal: Normal range of motion and neck supple.  Cardiovascular:     Rate and Rhythm: Regular rhythm.  Pulmonary:     Effort: Pulmonary effort is normal. No respiratory distress.  Abdominal:     General: There is no distension.     Palpations: Abdomen is soft.  Tenderness: There is no guarding.  Musculoskeletal:     Right lower leg: No edema.     Left lower leg: No edema.     Comments: Patient is very slow to rise from a seated position and has pain with extension of the lumbar spine and facet loading.  Somewhat difficult to the body habitus but she has no pain with hip rotation and no pain over the greater trochanters.  She does have a positive slump test on the left.  She has no clonus bilaterally and good distal strength.  Skin:    General: Skin is warm and dry.     Findings: No erythema or rash.  Neurological:     General: No focal deficit present.     Mental Status: She is alert and oriented to person, place, and time.     Sensory: Sensory deficit present.     Motor: No weakness or abnormal muscle tone.     Coordination: Coordination normal.     Gait: Gait abnormal.  Psychiatric:        Mood and Affect: Mood normal.        Behavior: Behavior normal.        Thought Content: Thought content normal.     Ortho Exam Imaging: No results found.  Past Medical/Family/Surgical/Social History: Medications & Allergies reviewed per EMR, new medications updated. There are no active problems to  display for this patient.  History reviewed. No pertinent past medical history. History reviewed. No pertinent family history. History reviewed. No pertinent surgical history. Social History   Occupational History  . Not on file  Tobacco Use  . Smoking status: Never Smoker  . Smokeless tobacco: Never Used  Substance and Sexual Activity  . Alcohol use: Never    Frequency: Never  . Drug use: Never  . Sexual activity: Never

## 2019-01-17 ENCOUNTER — Encounter (INDEPENDENT_AMBULATORY_CARE_PROVIDER_SITE_OTHER): Payer: Medicare Other | Admitting: Physical Medicine and Rehabilitation

## 2019-01-24 ENCOUNTER — Encounter (HOSPITAL_COMMUNITY): Payer: Self-pay

## 2019-01-24 ENCOUNTER — Ambulatory Visit (HOSPITAL_COMMUNITY)
Admission: EM | Admit: 2019-01-24 | Discharge: 2019-01-24 | Disposition: A | Discharge status: Home / Self Care | Payer: Medicare Other | Attending: Internal Medicine | Admitting: Internal Medicine

## 2019-01-24 DIAGNOSIS — N898 Other specified noninflammatory disorders of vagina: Secondary | ICD-10-CM

## 2019-01-24 DIAGNOSIS — N939 Abnormal uterine and vaginal bleeding, unspecified: Secondary | ICD-10-CM

## 2019-01-24 NOTE — ED Triage Notes (Signed)
Pt presents with possible tampon stuck in vagina; pt is unsure.

## 2019-01-24 NOTE — Discharge Instructions (Signed)
There was no tampon in the vagina, on exam it does appear you have what is likely a polyp on your cervix.  I would like you to see gynecology to have this further evaluated.

## 2019-01-25 NOTE — ED Provider Notes (Signed)
[facility]    CSN: [id number] Arrival date & time: 01/24/19  1930     History   Chief Complaint Chief Complaint  Patient presents with  . Tampon in Vagina    HPI Amy Mullins is a 58 y.o. female.   [name] presents with concerns of possible tampon in vagina. States she is on her period, started on 2/12. She was placing a tampon this evening and felt that it was not inserting as normal for her, and she felt as though she had swelling and maybe a previous tampon still in the vagina. She does not recall leaving a tampon in, however. She does not have pain. No change to bleeding. No other vaginal discharge. She does not follow with gynecology. Denies menopause. States she is also concerned she is getting cataracts and seeks recommendations for this. No vision change or eye pain, no drainage.    ROS per HPI.      History reviewed. No pertinent past medical history.  There are no active problems to display for this patient.   History reviewed. No pertinent surgical history.  OB History   No obstetric history on file.      Home Medications    Prior to Admission medications   Medication Sig Start Date End Date Taking? Authorizing Provider  gabapentin (NEURONTIN) 300 MG capsule Take 300 mg by mouth every 8 (eight) hours.    [provider]  naproxen (NAPROSYN) 375 MG tablet Take 1 tablet (375 mg total) by mouth 2 (two) times daily. Patient not taking: Reported on 01/06/2019 11/15/18   Petrucelli, Pleas Koch, PA-C  predniSONE (STERAPRED UNI-PAK 21 TAB) 10 MG (21) TBPK tablet Take by mouth daily. Take 6 tabs by mouth daily for 2 days, then 5 tabs for 2 days, then 4 tabs for 2 days, then 3 tabs for 2 days, 2 tabs for 2 days, then 1 tab by mouth daily for 2 days. Patient not taking: Reported on 01/06/2019 10/23/18   Carlyle Basques P, PA-C  tiZANidine (ZANAFLEX) 4 MG tablet Take 4 mg by mouth every 6 (six) hours as needed for muscle spasms.    [provider]  traMADol (ULTRAM) 50 MG tablet Take 50 mg by mouth every 6 (six) hours as needed for moderate pain.    [provider]    Family History History reviewed. No pertinent family history.  Social History Social History   Tobacco Use  . Smoking status: Never Smoker  . Smokeless tobacco: Never Used  Substance Use Topics  . Alcohol use: Never    Frequency: Never  . Drug use: Never     Allergies   Patient has no known allergies.   Review of Systems Review of Systems   Physical Exam Triage Vital Signs ED Triage Vitals  Enc Vitals Group     BP 01/24/19 2022 128/78     Pulse Rate 01/24/19 2022 82     Resp 01/24/19 2022 20     Temp 01/24/19 2022 98.1 F (36.7 C)     Temp Source 01/24/19 2022 Oral     SpO2 01/24/19 2022 97 %     Weight --      Height --      Head Circumference --      Peak Flow --      Pain Score 01/24/19 2029 0     Pain Loc --      Pain Edu? --      Excl. in GC? --  No data found.  Updated Vital Signs BP 128/78 (BP Location: Left Arm)   Pulse 82   Temp 98.1 F (36.7 C) (Oral)   Resp 20   LMP 01/19/2019   SpO2 97%   Visual Acuity Right Eye Distance:   Left Eye Distance:   Bilateral Distance:    Right Eye Near:   Left Eye Near:    Bilateral Near:     Physical Exam Constitutional:      General: She is not in acute distress.    Appearance: She is well-developed.  Eyes:     General: Lids are normal. Vision grossly intact.     Extraocular Movements: Extraocular movements intact.     Conjunctiva/sclera:     Right eye: Right conjunctiva is not injected. No exudate or hemorrhage.    Left eye: Left conjunctiva is not injected. No exudate or hemorrhage.    Comments: Slight clouding noted to sclera, PERRLA  Cardiovascular:     Rate and Rhythm: Normal rate and regular rhythm.     Heart sounds: Normal heart sounds.  Pulmonary:     Effort: Pulmonary effort is normal.     Breath sounds: Normal breath sounds.    Genitourinary:    General: Normal vulva.     Labia:        Right: No lesion.        Left: No lesion.      Vagina: Bleeding present.     Cervix: Lesion present.     Uterus: Normal.      Adnexa: Right adnexa normal and left adnexa normal.        Comments: Scant dark blood within vagina; polyp? To cervix noted, non tender; no foreign body or tampon Skin:    General: Skin is warm and dry.  Neurological:     Mental Status: She is alert and oriented to person, place, and time.      UC Treatments / Results  Labs (all labs ordered are listed, but only abnormal results are displayed) Labs Reviewed - No data to display  EKG None  Radiology No results found.  Procedures Procedures (including critical care time)  Medications Ordered in UC Medications - No data to display  Initial Impression / Assessment and Plan / UC Course  I have reviewed the triage vital signs and the nursing notes.  Pertinent labs & imaging results that were available during my care of the patient were reviewed by me and considered in my medical decision making (see chart for details).     59 year old with vaginal bleeding. No foreign body present. Polyp likely to cervix noted, non tender without palpable enlargement or uterus or adnexa. Recommended follow up with gynecology for repeat eval and follow up. Follow up with ophthalmology for eye exam. Patient verbalized understanding and agreeable to plan.   Final Clinical Impressions(s) / UC Diagnoses   Final diagnoses:  Vaginal irritation  Vaginal bleeding     Discharge Instructions     There was no tampon in the vagina, on exam it does appear you have what is likely a polyp on your cervix.  I would like you to see gynecology to have this further evaluated.     ED Prescriptions    None     Controlled Substance Prescriptions [place] Controlled Substance Registry consulted? Not Applicable   Georgetta Haber, NP 01/25/19 1129

## 2019-02-05 NOTE — Progress Notes (Addendum)
[facility] - Clinic Note  02/07/2019     CHIEF COMPLAINT Patient presents for Retina Evaluation   HISTORY OF PRESENT ILLNESS: Amy Mullins is a 58 y.o. female who presents to the clinic today for:   HPI    Retina Evaluation    In right eye.  This started 1 week ago.  Duration of 1 week.  Associated Symptoms Floaters.  Context:  distance vision, mid-range vision and near vision.  Treatments tried include no treatments.  I, the attending physician,  performed the HPI with the patient and updated documentation appropriately.          Comments    58 y/o female pt referred by Dr. Susette RacerMay Rumbach for eval of hemorrhagic PVD OD.  Saw Dr. Demetrios Isaacsumbach about 1 wk ago.  Sudden onset floaters OD x 2 wks.  Floaters have been constant since.  Pt does not recall doing anything that would have triggered symptoms.  Denies pain, flashes.  VA fair OU cc, but could be better.  No gtts.       Last edited by Rennis ChrisZamora, Sylis Ketchum, MD on 02/07/2019 10:23 AM. (History)    pt states she saw Dr. Demetrios Isaacsumbach last week bc she was seeing floaters in her right eye, she states she has seen him a total of 2 times and he referred her here, she states he saw something on exam the second time that he didn't see the first time, she states the floater seems to have gotten better since she saw him, pt states she has never had any eye problems or sx  Referring physician: Fredrich BirksScott, Jon, OD [address] [place] [id number]  HISTORICAL INFORMATION:   Selected notes from the MEDICAL RECORD NUMBER Referred by Dr. Demetrios Isaacsumbach for concern of hemorrhagic PVD LEE:  Ocular Hx- PMH-    CURRENT MEDICATIONS: No current outpatient medications on file. (Ophthalmic Drugs)   No current facility-administered medications for this visit.  (Ophthalmic Drugs)   Current Outpatient Medications (Other)  Medication Sig  . gabapentin (NEURONTIN) 300 MG capsule Take 300 mg by mouth every 8 (eight) hours.  . naproxen (NAPROSYN)  375 MG tablet Take 1 tablet (375 mg total) by mouth 2 (two) times daily. (Patient not taking: Reported on 01/06/2019)  . predniSONE (STERAPRED UNI-PAK 21 TAB) 10 MG (21) TBPK tablet Take by mouth daily. Take 6 tabs by mouth daily for 2 days, then 5 tabs for 2 days, then 4 tabs for 2 days, then 3 tabs for 2 days, 2 tabs for 2 days, then 1 tab by mouth daily for 2 days. (Patient not taking: Reported on 01/06/2019)  . tiZANidine (ZANAFLEX) 4 MG tablet Take 4 mg by mouth every 6 (six) hours as needed for muscle spasms.  . traMADol (ULTRAM) 50 MG tablet Take 50 mg by mouth every 6 (six) hours as needed for moderate pain.   No current facility-administered medications for this visit.  (Other)      REVIEW OF SYSTEMS: ROS    Positive for: Eyes   Negative for: Constitutional, Gastrointestinal, Neurological, Skin, Genitourinary, Musculoskeletal, HENT, Endocrine, Cardiovascular, Respiratory, Psychiatric, Allergic/Imm, Heme/Lymph   Last edited by Celine MansBaxley, Andrew G, COA on 02/07/2019  9:21 AM. (History)       ALLERGIES No Known Allergies  PAST MEDICAL HISTORY History reviewed. No pertinent past medical history. History reviewed. No pertinent surgical history.  FAMILY HISTORY History reviewed. No pertinent family history.  SOCIAL HISTORY Social History   Tobacco Use  .  Smoking status: Never Smoker  . Smokeless tobacco: Never Used  Substance Use Topics  . Alcohol use: Never    Frequency: Never  . Drug use: Never         OPHTHALMIC EXAM:  Base Eye Exam    Visual Acuity (Snellen - Linear)      Right Left   Dist cc 20/30 20/40 +   Dist ph cc NI 20/30 -2   Correction:  Glasses       Tonometry (Tonopen, 9:24 AM)      Right Left   Pressure 11 10       Pupils      Dark Light Shape React APD   Right 4 3 Round Brisk None   Left 4 3 Round Brisk None       Visual Fields (Counting fingers)      Left Right    Full Full       Extraocular Movement      Right Left    Full, Ortho  Full, Ortho       Neuro/Psych    Oriented x3:  Yes   Mood/Affect:  Normal       Dilation    Both eyes:  1.0% Mydriacyl, 2.5% Phenylephrine @ 9:24 AM        Slit Lamp and Fundus Exam    Slit Lamp Exam      Right Left   Lids/Lashes Dermatochalasis - upper lid Dermatochalasis - upper lid   Conjunctiva/Sclera Melanosis Melanosis   Cornea 1+ Punctate epithelial erosions 1+ Punctate epithelial erosions   Anterior Chamber deep and clear deep and clear   Iris Round and dilated Round and dilated   Lens 1-2+ Nuclear sclerosis, 2+ Cortical cataract 1-2+ Nuclear sclerosis, 2+ Cortical cataract   Vitreous Vitreous syneresis, no tobacco dusting, Posterior vitreous detachment with mild blood staining, peripheral vitreous condensations, mild old VH settling inferiorly Vitreous syneresis       Fundus Exam      Right Left   Disc Pink and Sharp Pink and Sharp, mild temporal Peripapillary atrophy   C/D Ratio 0.3 0.4   Macula Good foveal reflex, Retinal pigment epithelial mottling, No heme or edema Good foveal reflex, mild Retinal pigment epithelial mottling, No heme or edema   Vessels Vascular attenuation, mild Tortuousity Vascular attenuation   Periphery Attached, focal RPE clumping inferiorly at 0700, 360 peripheral cystoid degeneration, VR tuft at 1030 Attached, 360 pigmented peripheral cystoid degeneration        Refraction    Wearing Rx      Sphere Cylinder Axis Add   Right -6.25 +1.50 090 +2.25   Left -4.50 +1.75 080 +2.25   Age:  64yr   Type:  Bifocal       Manifest Refraction      Sphere Cylinder Axis Dist VA   Right -6.75 +1.25 083 20/25   Left -5.00 +1.75 075 20/25+          IMAGING AND PROCEDURES  Imaging and Procedures for @TODAY @  OCT, Retina - OU - Both Eyes       Right Eye Quality was good. Central Foveal Thickness: 246. Progression has no prior data. Findings include normal foveal contour, no IRF, no SRF.   Left Eye Quality was good. Central Foveal  Thickness: 274. Progression has no prior data. Findings include normal foveal contour, no SRF, no IRF.   Notes *Images captured and stored on drive  Diagnosis / Impression:  NFP, no IRF/SRF OU  Clinical management:  See below  Abbreviations: NFP - Normal foveal profile. CME - cystoid macular edema. PED - pigment epithelial detachment. IRF - intraretinal fluid. SRF - subretinal fluid. EZ - ellipsoid zone. ERM - epiretinal membrane. ORA - outer retinal atrophy. ORT - outer retinal tubulation. SRHM - subretinal hyper-reflective material                 ASSESSMENT/PLAN:    ICD-10-CM   1. Posterior vitreous detachment of right eye H43.811   2. Vitreous hemorrhage of right eye (HCC) H43.11   3. Retinal edema H35.81 OCT, Retina - OU - Both Eyes  4. Combined forms of age-related cataract of both eyes H25.813     1,2. Hemorrhagic PVD OD  Onset ~2 wks -- presented to Dr. Loleta Chance / [facility]  Mild VH -- clearing / improving  No RT or RD on 360 scleral depressed exam  Discussed findings and prognosis  Reviewed s/s of RT/RD  Strict return precautions for any such RT/RD signs/symptoms  VH precautions reviewed -- minimize activities, keep head elevated, avoid ASA/NSAIDs/blood thinners as able  F/U 4 weeks  3. No retinal edema on exam or OCT  4. Mixed cataracts OU - The symptoms of cataract, surgical options, and treatments and risks were discussed with patient. - discussed diagnosis and progression - not yet visually significant - monitor for now   Ophthalmic Meds Ordered this visit:  No orders of the defined types were placed in this encounter.      Return in about 4 weeks (around 03/07/2019) for f/u PVD OD, DFE, OCT.  There are no Patient Instructions on file for this visit.   Explained the diagnoses, plan, and follow up with the patient and they expressed understanding.  Patient expressed understanding of the importance of proper follow up care.    This document serves as a record of services personally performed by Karie Chimera, MD, PhD. It was created on their behalf by Laurian Brim, OA, an ophthalmic assistant. The creation of this record is the provider's dictation and/or activities during the visit.    Electronically signed by: Laurian Brim, OA  02.29.2020 1:06 AM    Karie Chimera, M.D., Ph.D. Diseases & Surgery of the Retina and Vitreous [facility]   I have reviewed the above documentation for accuracy and completeness, and I agree with the above. Karie Chimera, M.D., Ph.D. 02/09/19 1:06 AM     Abbreviations: M myopia (nearsighted); A astigmatism; H hyperopia (farsighted); P presbyopia; Mrx spectacle prescription;  CTL contact lenses; OD right eye; OS left eye; OU both eyes  XT exotropia; ET esotropia; PEK punctate epithelial keratitis; PEE punctate epithelial erosions; DES dry eye syndrome; MGD meibomian gland dysfunction; ATs artificial tears; PFAT's preservative free artificial tears; NSC nuclear sclerotic cataract; PSC posterior subcapsular cataract; ERM epi-retinal membrane; PVD posterior vitreous detachment; RD retinal detachment; DM diabetes mellitus; DR diabetic retinopathy; NPDR non-proliferative diabetic retinopathy; PDR proliferative diabetic retinopathy; CSME clinically significant macular edema; DME diabetic macular edema; dbh dot blot hemorrhages; CWS cotton wool spot; POAG primary open angle glaucoma; C/D cup-to-disc ratio; HVF humphrey visual field; GVF goldmann visual field; OCT optical coherence tomography; IOP intraocular pressure; BRVO Branch retinal vein occlusion; CRVO central retinal vein occlusion; CRAO central retinal artery occlusion; BRAO branch retinal artery occlusion; RT retinal tear; SB scleral buckle; PPV pars plana vitrectomy; VH Vitreous hemorrhage; PRP panretinal laser photocoagulation; IVK intravitreal kenalog; VMT vitreomacular traction; MH Macular hole;  NVD  neovascularization of the disc; NVE neovascularization elsewhere; AREDS age related eye disease study; ARMD age related macular degeneration; POAG primary open angle glaucoma; EBMD epithelial/anterior basement membrane dystrophy; ACIOL anterior chamber intraocular lens; IOL intraocular lens; PCIOL posterior chamber intraocular lens; Phaco/IOL phacoemulsification with intraocular lens placement; PRK photorefractive keratectomy; LASIK laser assisted in situ keratomileusis; HTN hypertension; DM diabetes mellitus; COPD chronic obstructive pulmonary disease

## 2019-02-07 ENCOUNTER — Ambulatory Visit (INDEPENDENT_AMBULATORY_CARE_PROVIDER_SITE_OTHER): Payer: Medicare Other | Admitting: Ophthalmology

## 2019-02-07 ENCOUNTER — Encounter (INDEPENDENT_AMBULATORY_CARE_PROVIDER_SITE_OTHER): Payer: Self-pay | Admitting: Ophthalmology

## 2019-02-07 DIAGNOSIS — H4311 Vitreous hemorrhage, right eye: Secondary | ICD-10-CM

## 2019-02-07 DIAGNOSIS — H3581 Retinal edema: Secondary | ICD-10-CM | POA: Diagnosis not present

## 2019-02-07 DIAGNOSIS — H43811 Vitreous degeneration, right eye: Secondary | ICD-10-CM

## 2019-02-07 DIAGNOSIS — H25813 Combined forms of age-related cataract, bilateral: Secondary | ICD-10-CM

## 2019-02-09 ENCOUNTER — Encounter (INDEPENDENT_AMBULATORY_CARE_PROVIDER_SITE_OTHER): Payer: Self-pay | Admitting: Ophthalmology

## 2019-02-14 ENCOUNTER — Other Ambulatory Visit: Payer: Self-pay | Admitting: Rehabilitation

## 2019-02-14 DIAGNOSIS — M5432 Sciatica, left side: Principal | ICD-10-CM

## 2019-02-14 DIAGNOSIS — M5431 Sciatica, right side: Secondary | ICD-10-CM

## 2019-02-17 ENCOUNTER — Ambulatory Visit
Admission: RE | Admit: 2019-02-17 | Discharge: 2019-02-17 | Disposition: A | Discharge status: Home / Self Care | Payer: Medicare Other | Source: Ambulatory Visit | Attending: Rehabilitation | Admitting: Rehabilitation

## 2019-02-17 ENCOUNTER — Other Ambulatory Visit: Payer: Self-pay

## 2019-02-17 DIAGNOSIS — M5431 Sciatica, right side: Secondary | ICD-10-CM

## 2019-02-17 DIAGNOSIS — M5432 Sciatica, left side: Principal | ICD-10-CM

## 2019-02-22 ENCOUNTER — Telehealth: Payer: Self-pay | Admitting: Obstetrics & Gynecology

## 2019-02-22 NOTE — Telephone Encounter (Signed)
Called and left a VM about only having one person with you at the visit.

## 2019-02-23 ENCOUNTER — Encounter: Payer: Self-pay | Admitting: Obstetrics & Gynecology

## 2019-02-23 ENCOUNTER — Ambulatory Visit (INDEPENDENT_AMBULATORY_CARE_PROVIDER_SITE_OTHER): Payer: Medicare Other | Admitting: Obstetrics & Gynecology

## 2019-02-23 ENCOUNTER — Other Ambulatory Visit (HOSPITAL_COMMUNITY)
Admission: RE | Admit: 2019-02-23 | Discharge: 2019-02-23 | Disposition: A | Discharge status: Home / Self Care | Payer: Medicare Other | Source: Ambulatory Visit | Attending: Obstetrics & Gynecology | Admitting: Obstetrics & Gynecology

## 2019-02-23 ENCOUNTER — Other Ambulatory Visit: Payer: Self-pay

## 2019-02-23 VITALS — BP 131/72 | HR 84 | Wt 235.0 lb

## 2019-02-23 DIAGNOSIS — N939 Abnormal uterine and vaginal bleeding, unspecified: Secondary | ICD-10-CM | POA: Insufficient documentation

## 2019-02-23 DIAGNOSIS — Z1151 Encounter for screening for human papillomavirus (HPV): Secondary | ICD-10-CM | POA: Diagnosis not present

## 2019-02-23 DIAGNOSIS — Z1231 Encounter for screening mammogram for malignant neoplasm of breast: Secondary | ICD-10-CM | POA: Diagnosis not present

## 2019-02-23 NOTE — Patient Instructions (Signed)
Abnormal Uterine Bleeding  Abnormal uterine bleeding is unusual bleeding from the uterus. It includes:   Bleeding or spotting between periods.   Bleeding after sex.   Bleeding that is heavier than normal.   Periods that last longer than usual.   Bleeding after menopause.  Abnormal uterine bleeding can affect women at various stages in life, including teenagers, women in their reproductive years, pregnant women, and women who have reached menopause. Common causes of abnormal uterine bleeding include:   Pregnancy.   Growths of tissue (polyps).   A noncancerous tumor in the uterus (fibroid).   Infection.   Cancer.   Hormonal imbalances.  Any type of abnormal bleeding should be evaluated by a health care provider. Many cases are minor and simple to treat, while others are more serious. Treatment will depend on the cause of the bleeding.  Follow these instructions at home:   Monitor your condition for any changes.   Do not use tampons, douche, or have sex if told by your health care provider.   Change your pads often.   Get regular exams that include pelvic exams and cervical cancer screening.   Keep all follow-up visits as told by your health care provider. This is important.  Contact a health care provider if:   Your bleeding lasts for more than one week.   You feel dizzy at times.   You feel nauseous or you vomit.  Get help right away if:   You pass out.   Your bleeding soaks through a pad every hour.   You have abdominal pain.   You have a fever.   You become sweaty or weak.   You pass large blood clots from your vagina.  Summary   Abnormal uterine bleeding is unusual bleeding from the uterus.   Any type of abnormal bleeding should be evaluated by a health care provider. Many cases are minor and simple to treat, while others are more serious.   Treatment will depend on the cause of the bleeding.  This information is not intended to replace advice given to you by your health care provider.  Make sure you discuss any questions you have with your health care provider.  Document Released: 11/24/2005 Document Revised: 12/26/2016 Document Reviewed: 12/26/2016  Elsevier Interactive Patient Education  2019 Elsevier Inc[employer]

## 2019-02-23 NOTE — Progress Notes (Signed)
GYNECOLOGY OFFICE VISIT NOTE   History:  Amy Mullins is a 58 y.o. F here today for  Evaluation of abnormal uterine bleeding (AUB).  Reports still having monthly menstrual periods, but had prolonged episode of bleeding lasting 3 weeks last month. Reports her mother went through menopause at age 66!  She went to ED for evaluation of this and possible retained tampon.  No tampon was found, there was concern about a possible cervical polyp, and she was sent here for further evaluation.  She denies any current abnormal vaginal discharge, bleeding, pelvic pain or other concerns.    Past Medical History:  Diagnosis Date  . Chronic back pain   . Obesity   . Sciatica     History reviewed. No pertinent surgical history.  The following portions of the patient's history were reviewed and updated as appropriate: allergies, current medications, past family history, past medical history, past social history, past surgical history and problem list.   Health Maintenance:  Normal pap and mammogram about 5-6 years ago.   Review of Systems:  Pertinent items noted in HPI and remainder of comprehensive ROS otherwise negative.  Physical Exam:  BP 131/72   Pulse 84   Wt 235 lb (106.6 kg)   BMI 36.81 kg/m  CONSTITUTIONAL: Well-developed, well-nourished female in no acute distress.  HEENT:  Normocephalic, atraumatic. External right and left ear normal. No scleral icterus.  NECK: Normal range of motion, supple, no masses noted on observation SKIN: No rash noted. Not diaphoretic. No erythema. No pallor. MUSCULOSKELETAL: Normal range of motion. No edema noted. NEUROLOGIC: Alert and oriented to person, place, and time. Normal muscle tone coordination. No cranial nerve deficit noted. PSYCHIATRIC: Normal mood and affect. Normal behavior. Normal judgment and thought content. CARDIOVASCULAR: Normal heart rate noted RESPIRATORY: Effort and breath sounds normal, no problems with respiration noted ABDOMEN:  Obese abdomen. No masses noted. No other overt distention noted.  PELVIC: Normal appearing external genitalia; normal appearing vaginal mucosa and cervix. Small lesion noted on anterior cervical lip near os, about 1 cm in diameter. Not a polyp, but it is purple in color with increased surface vascularity.  Significant bleeding noted from endocervical canal during Pap smear, not from lesion.  No abnormal discharge noted. Unable to palpate uterus or adnexa due to habitus, no uterine or adnexal tenderness on exam.  ENDOMETRIAL BIOPSY     The indications for endometrial biopsy were reviewed.   Risks of the biopsy including cramping, bleeding, infection, uterine perforation, inadequate specimen and need for additional procedures  were discussed. The patient states she understands and agrees to undergo procedure today. Consent was signed. Time out was performed. Urine HCG was negative. During the pelvic exam, the cervix was prepped with Betadine. A single-toothed tenaculum was placed on the anterior lip of the cervix to stabilize it. The 3 mm pipelle was introduced into the endometrial cavity without difficulty to a depth of 9cm, and a moderate amount of tissue was obtained and sent to pathology. The instruments were removed from the patient's vagina. Minimal bleeding from the cervix was noted. The patient tolerated the procedure well. Routine post-procedure instructions were given to the patient.       Assessment and Plan:     1. Abnormal uterine bleeding (AUB) Still worried about hyperplasia or neoplasia, pap and endometrial biopsy done. will follow up results and manage accordingly. Infection screen done, also CBC and TSH checked. Ultrasound ordered.  No bl eeding now. If recurs, will consider medical management  with progestin therapy. May also consider cervical biopsy of that lesion; likely be done with LEEP equipment given its vascularity.  Patient will be contacted with results and any further indicated  management. - CBC - TSH - Cytology - PAP - Surgical pathology - Cervicovaginal ancillary only - US PELVIC COMPLETE WITH TRANSVAGINAL; Future  2. Encounter for screening mammogram for breast cancer - MM 3D SCREEN BREAST BILATERAL; Future Mammogram ordered.  Other routine preventative health maintenance measures emphasized. Please refer to After Visit Summary for other counseling recommendations.   Return in about 2 weeks (around 03/09/2019) for Followup AUB.    Total face-to-face time with patient: 30 minutes.  Over 50% of encounter was spent on counseling and coordination of care.   Jaynie Collins, MD, FACOG Obstetrician & Gynecologist, [facility] for [employer], [facility]

## 2019-02-24 LAB — CBC
HEMOGLOBIN: 11.7 g/dL (ref 11.1–15.9)
Hematocrit: 39.1 % (ref 34.0–46.6)
MCH: 22 pg — ABNORMAL LOW (ref 26.6–33.0)
MCHC: 29.9 g/dL — ABNORMAL LOW (ref 31.5–35.7)
MCV: 73 fL — ABNORMAL LOW (ref 79–97)
Platelets: 383 10*3/uL (ref 150–450)
RBC: 5.33 x10E6/uL — AB (ref 3.77–5.28)
RDW: 13.4 % (ref 11.7–15.4)
WBC: 10.3 10*3/uL (ref 3.4–10.8)

## 2019-02-24 LAB — TSH: TSH: 0.921 u[IU]/mL (ref 0.450–4.500)

## 2019-02-25 ENCOUNTER — Encounter: Payer: Self-pay | Admitting: Obstetrics & Gynecology

## 2019-02-25 DIAGNOSIS — N939 Abnormal uterine and vaginal bleeding, unspecified: Secondary | ICD-10-CM | POA: Insufficient documentation

## 2019-02-26 ENCOUNTER — Encounter (HOSPITAL_COMMUNITY): Payer: Self-pay

## 2019-02-26 ENCOUNTER — Emergency Department (HOSPITAL_COMMUNITY)
Admission: EM | Admit: 2019-02-26 | Discharge: 2019-02-26 | Disposition: A | Discharge status: Home / Self Care | Payer: Medicare Other | Attending: Emergency Medicine | Admitting: Emergency Medicine

## 2019-02-26 ENCOUNTER — Other Ambulatory Visit: Payer: Self-pay

## 2019-02-26 DIAGNOSIS — X58XXXA Exposure to other specified factors, initial encounter: Secondary | ICD-10-CM | POA: Insufficient documentation

## 2019-02-26 DIAGNOSIS — Y939 Activity, unspecified: Secondary | ICD-10-CM | POA: Insufficient documentation

## 2019-02-26 DIAGNOSIS — Z79899 Other long term (current) drug therapy: Secondary | ICD-10-CM | POA: Diagnosis not present

## 2019-02-26 DIAGNOSIS — Y999 Unspecified external cause status: Secondary | ICD-10-CM | POA: Diagnosis not present

## 2019-02-26 DIAGNOSIS — Y929 Unspecified place or not applicable: Secondary | ICD-10-CM | POA: Insufficient documentation

## 2019-02-26 DIAGNOSIS — S161XXA Strain of muscle, fascia and tendon at neck level, initial encounter: Secondary | ICD-10-CM | POA: Insufficient documentation

## 2019-02-26 DIAGNOSIS — R079 Chest pain, unspecified: Secondary | ICD-10-CM | POA: Diagnosis present

## 2019-02-26 LAB — CERVICOVAGINAL ANCILLARY ONLY
BACTERIAL VAGINITIS: POSITIVE — AB
CANDIDA VAGINITIS: NEGATIVE
CHLAMYDIA, DNA PROBE: NEGATIVE
Neisseria Gonorrhea: NEGATIVE
Trichomonas: NEGATIVE

## 2019-02-26 MED ORDER — METHOCARBAMOL 500 MG PO TABS
500.0000 mg | ORAL_TABLET | Freq: Once | ORAL | Status: AC
  Administered 2019-02-26: 500 mg via ORAL
  Filled 2019-02-26: qty 1

## 2019-02-26 MED ORDER — OXYCODONE-ACETAMINOPHEN 5-325 MG PO TABS
1.0000 | ORAL_TABLET | Freq: Once | ORAL | Status: AC
  Administered 2019-02-26: 1 via ORAL
  Filled 2019-02-26: qty 1

## 2019-02-26 MED ORDER — IBUPROFEN 600 MG PO TABS: 600.0000 mg | ORAL_TABLET | Freq: Three times a day (TID) | ORAL | 0 refills | Status: DC | PRN

## 2019-02-26 MED ORDER — METHOCARBAMOL 500 MG PO TABS: 500.0000 mg | ORAL_TABLET | Freq: Three times a day (TID) | ORAL | 0 refills | Status: DC | PRN

## 2019-02-26 MED ORDER — IBUPROFEN 400 MG PO TABS
600.0000 mg | ORAL_TABLET | Freq: Once | ORAL | Status: AC
  Administered 2019-02-26: 600 mg via ORAL
  Filled 2019-02-26: qty 1

## 2019-02-26 NOTE — ED Provider Notes (Signed)
[facility] EMERGENCY DEPARTMENT Provider Note   CSN: [id number] Arrival date & time: 02/26/19  1620    History   Chief Complaint Chief Complaint  Patient presents with  . Chest Pain    HPI Amy Mullins is a 58 y.o. female.     HPI Patient is a 58 year old female presents the emergency department 48 hours of worsening pain in her right neck with some radiation towards her right shoulder.  She has had this pain since waking up 2 mornings ago.  It is worse when she turns her head to the left.  No weakness of her arms or legs.  Some of the pain radiates into her anterior right chest.  She has a longstanding history of chronic back pain for which she previously was treated with Percocet through [place].  She is currently without a primary care physician since relocating to [place] in October 2019.  She has not tried ibuprofen today.  She has been trying heat without improvement.  No shortness of breath.  No unilateral leg swelling.  No weakness of her arms or legs.  Pain is moderate in severity Past Medical History:  Diagnosis Date  . Chronic back pain   . Obesity   . Sciatica     Patient Active Problem List   Diagnosis Date Noted  . Abnormal uterine bleeding (AUB) 02/25/2019    History reviewed. No pertinent surgical history.   OB History   No obstetric history on file.      Home Medications    Prior to Admission medications   Medication Sig Start Date End Date Taking? Authorizing Provider  gabapentin (NEURONTIN) 300 MG capsule Take 300 mg by mouth every 8 (eight) hours.    [provider]  ibuprofen (ADVIL,MOTRIN) 600 MG tablet Take 1 tablet (600 mg total) by mouth every 8 (eight) hours as needed. 02/26/19   Azalia Bilis, MD  methocarbamol (ROBAXIN) 500 MG tablet Take 1 tablet (500 mg total) by mouth every 8 (eight) hours as needed for muscle spasms. 02/26/19   Azalia Bilis, MD  naproxen (NAPROSYN) 375 MG tablet Take 1 tablet (375 mg  total) by mouth 2 (two) times daily. 11/15/18   Petrucelli, Samantha R, PA-C  predniSONE (STERAPRED UNI-PAK 21 TAB) 10 MG (21) TBPK tablet Take by mouth daily. Take 6 tabs by mouth daily for 2 days, then 5 tabs for 2 days, then 4 tabs for 2 days, then 3 tabs for 2 days, 2 tabs for 2 days, then 1 tab by mouth daily for 2 days. 10/23/18   Carlyle Basques P, PA-C  tiZANidine (ZANAFLEX) 4 MG tablet Take 4 mg by mouth every 6 (six) hours as needed for muscle spasms.    [provider]  traMADol (ULTRAM) 50 MG tablet Take 50 mg by mouth every 6 (six) hours as needed for moderate pain.    [provider]    Family History History reviewed. No pertinent family history.  Social History Social History   Tobacco Use  . Smoking status: Never Smoker  . Smokeless tobacco: Never Used  Substance Use Topics  . Alcohol use: Never    Frequency: Never  . Drug use: Never     Allergies   Patient has no known allergies.   Review of Systems Review of Systems  All other systems reviewed and are negative.    Physical Exam Updated Vital Signs BP 136/68 (BP Location: Right Arm)   Pulse 82   Temp 98.7 F (  37.1 C) (Oral)   Resp 18   Ht 5\' 7"  (1.702 m)   Wt (!) 163.3 kg   LMP 01/17/2019   SpO2 100%   BMI 56.38 kg/m   Physical Exam Vitals signs and nursing note reviewed.  Constitutional:      General: She is not in acute distress.    Appearance: She is well-developed.  HENT:     Head: Normocephalic and atraumatic.  Neck:     Musculoskeletal: Neck supple.     Comments: Paracervical tenderness on the right.  No erythema or warmth.  No unilateral neck swelling Cardiovascular:     Rate and Rhythm: Normal rate and regular rhythm.     Heart sounds: Normal heart sounds.  Pulmonary:     Effort: Pulmonary effort is normal.     Breath sounds: Normal breath sounds.  Abdominal:     General: There is no distension.     Palpations: Abdomen is soft.     Tenderness: There is no  abdominal tenderness.  Musculoskeletal: Normal range of motion.     Comments: Normal grip strength bilaterally.  Normal right radial pulse.  No swelling of the right upper extremity as compared to left.  Full range of motion of bilateral shoulders.  No C-spine point tenderness  Skin:    General: Skin is warm and dry.  Neurological:     Mental Status: She is alert and oriented to person, place, and time.  Psychiatric:        Judgment: Judgment normal.      ED Treatments / Results  Labs (all labs ordered are listed, but only abnormal results are displayed) Labs Reviewed - No data to display  EKG EKG Interpretation  Date/Time:  Saturday February 26 2019 [id number]:10:[id number]:24 EDT Ventricular Rate:  85 PR Interval:    QRS Duration: 97 QT Interval:  355 QTC Calculation: 423 R Axis:   63 Text Interpretation:  Normal sinus rhythm Ventricular premature complex Low voltage, precordial leads Consider anterior infarct No old tracing to compare Confirmed by Azalia Bilisampos, Jackolyn Geron ([id number]) on 02/26/2019 4:59:00 PM   Radiology No results found.  Procedures Procedures (including critical care time)  Medications Ordered in ED Medications  ibuprofen (ADVIL,MOTRIN) tablet 600 mg (has no administration in time range)  methocarbamol (ROBAXIN) tablet 500 mg (has no administration in time range)  oxyCODONE-acetaminophen (PERCOCET/ROXICET) 5-325 MG per tablet 1 tablet (has no administration in time range)     Initial Impression / Assessment and Plan / ED Course  I have reviewed the triage vital signs and the nursing notes.  Pertinent labs & imaging results that were available during my care of the patient were reviewed by me and considered in my medical decision making (see chart for details).        Suspect cervical strain/spasm.  Home with ibuprofen and muscle relaxant.  Recommend heat.  Primary care follow-up.  Patient encouraged to return to the ER for new or worsening symptoms.  Doubt pulmonary pathology.   Doubt ACS and dissection.  This appears to be cervical spasm/strain  Final Clinical Impressions(s) / ED Diagnoses   Final diagnoses:  Cervical strain, acute, initial encounter    ED Discharge Orders         Ordered    ibuprofen (ADVIL,MOTRIN) 600 MG tablet  Every 8 hours PRN     02/26/19 1651    methocarbamol (ROBAXIN) 500 MG tablet  Every 8 hours PRN     02/26/19 1652  Azalia Bilis, MD 02/26/19 1659

## 2019-02-26 NOTE — ED Triage Notes (Signed)
Per pt, has been having right sharp chest pain that radiates to right neck and right arm. Has had pain since this morning. Pt also has existing sciatica pain on left side with numbness to left toes

## 2019-02-26 NOTE — ED Notes (Signed)
Signature pad not working. Pt verbalized understanding ofDC instruction and prescriptions. No questions at this time

## 2019-02-28 ENCOUNTER — Telehealth: Payer: Self-pay

## 2019-02-28 LAB — CYTOLOGY - PAP
Diagnosis: NEGATIVE
HPV: NOT DETECTED

## 2019-02-28 NOTE — Telephone Encounter (Signed)
-----   Message from Tereso Newcomer, MD sent at 02/28/2019  2:40 PM EDT ----- Vaginal discharge test is abnormal and showed bacterial vaginitis. Metronidazole was prescribed. Please inform patient of results and advise to pick up prescription.

## 2019-02-28 NOTE — Telephone Encounter (Signed)
-----   Message from Tereso Newcomer, MD sent at 02/25/2019 10:17 AM EDT ----- 02/23/2028 Endometrium, biopsy - ENDOMETRIOID TYPE POLYP(S). - DEGENERATING SECRETORY-TYPE ENDOMETRIUM. - THERE IS NO EVIDENCE OF HYPERPLASIA OR MALIGNANCY. Benign endometrial biopsy, pap smear results still pending. Please call to inform patient of results.

## 2019-02-28 NOTE — Telephone Encounter (Signed)
Called pt to advise of Endo Bx results & that the  Pap results are still pending. Also gave [place] appt date. Pt verbalized understanding.

## 2019-02-28 NOTE — Telephone Encounter (Signed)
Called Pt to advice of + BV results & that Rx was sent to pharmacy on file. Pt verbalized understanding.

## 2019-03-02 ENCOUNTER — Telehealth: Payer: Self-pay

## 2019-03-02 DIAGNOSIS — B9689 Other specified bacterial agents as the cause of diseases classified elsewhere: Secondary | ICD-10-CM

## 2019-03-02 DIAGNOSIS — N760 Acute vaginitis: Principal | ICD-10-CM

## 2019-03-02 NOTE — Telephone Encounter (Signed)
Pt called stating that she was suppose to get an Rx filled but pharmacy did not have it.

## 2019-03-03 ENCOUNTER — Ambulatory Visit (HOSPITAL_COMMUNITY): Admission: RE | Admit: 2019-03-03 | Payer: Medicare Other | Source: Ambulatory Visit

## 2019-03-03 MED ORDER — METRONIDAZOLE 500 MG PO TABS: 500.0000 mg | ORAL_TABLET | Freq: Two times a day (BID) | ORAL | 0 refills | Status: DC

## 2019-03-03 NOTE — Telephone Encounter (Signed)
Demi called again and left message her medicine not at pharmacy. Per chart per Dr. Macon Large wrote flagyl sent for BV but per chart review no rx written. I called [facility] and apologized med not sent but informed her I will send to pharmacy today- call re: hours before she goes to pick it up due to coronavirus. She voices understanding.

## 2019-03-04 ENCOUNTER — Encounter: Payer: Self-pay | Admitting: *Deleted

## 2019-03-07 ENCOUNTER — Telehealth: Payer: Self-pay | Admitting: Family Medicine

## 2019-03-07 NOTE — Telephone Encounter (Signed)
Called patient to let her know for her appt on wednesday 03-09-2019 the Doctor will call her at her appt time due to COVID 19, patient understood

## 2019-03-08 NOTE — Progress Notes (Signed)
[facility] - Clinic Note  03/14/2019     CHIEF COMPLAINT Patient presents for Retina Follow Up   HISTORY OF PRESENT ILLNESS: Amy Mullins is a 58 y.o. female who presents to the clinic today for:   HPI    Retina Follow Up    Patient presents with  PVD.  In right eye.  This started 4 weeks ago.  Severity is moderate.  Duration of 4 weeks.  Since onset it is gradually improving.  I, the attending physician,  performed the HPI with the patient and updated documentation appropriately.          Comments    58 y/o female pt here for 4 wk f/u for hemorrhagic PVD OD.  VA OD seems a little clearer.  No change in [place] OS.  Denies pain, flashes, floaters.  No gtts.       Last edited by Rennis Chris, MD on 03/14/2019  3:26 PM. (History)    pt states she feels like her vision is better, she denies new FOL or floaters  Referring physician: No referring provider defined for this encounter.  HISTORICAL INFORMATION:   Selected notes from the MEDICAL RECORD NUMBER Referred by Dr. Demetrios Isaacs for concern of hemorrhagic PVD LEE:  Ocular Hx- PMH-    CURRENT MEDICATIONS: No current outpatient medications on file. (Ophthalmic Drugs)   No current facility-administered medications for this visit.  (Ophthalmic Drugs)   Current Outpatient Medications (Other)  Medication Sig  . gabapentin (NEURONTIN) 300 MG capsule Take 300 mg by mouth every 8 (eight) hours.  Marland Kitchen ibuprofen (ADVIL,MOTRIN) 600 MG tablet Take 1 tablet (600 mg total) by mouth every 8 (eight) hours as needed.  . methocarbamol (ROBAXIN) 500 MG tablet Take 1 tablet (500 mg total) by mouth every 8 (eight) hours as needed for muscle spasms. (Patient not taking: Reported on 03/09/2019)  . metroNIDAZOLE (FLAGYL) 500 MG tablet Take 1 tablet (500 mg total) by mouth 2 (two) times daily.  . naproxen (NAPROSYN) 375 MG tablet Take 1 tablet (375 mg total) by mouth 2 (two) times daily.  . predniSONE (STERAPRED UNI-PAK 21 TAB) 10 MG (21)  TBPK tablet Take by mouth daily. Take 6 tabs by mouth daily for 2 days, then 5 tabs for 2 days, then 4 tabs for 2 days, then 3 tabs for 2 days, 2 tabs for 2 days, then 1 tab by mouth daily for 2 days.  Marland Kitchen tiZANidine (ZANAFLEX) 4 MG tablet Take 4 mg by mouth every 6 (six) hours as needed for muscle spasms.  . traMADol (ULTRAM) 50 MG tablet Take 50 mg by mouth every 6 (six) hours as needed for moderate pain.   No current facility-administered medications for this visit.  (Other)      REVIEW OF SYSTEMS: ROS    Positive for: Eyes   Negative for: Constitutional, Gastrointestinal, Neurological, Skin, Genitourinary, Musculoskeletal, HENT, Endocrine, Cardiovascular, Respiratory, Psychiatric, Allergic/Imm, Heme/Lymph   Last edited by Celine Mans, COA on 03/14/2019  3:12 PM. (History)       ALLERGIES No Known Allergies  PAST MEDICAL HISTORY Past Medical History:  Diagnosis Date  . Chronic back pain   . Obesity   . Sciatica    History reviewed. No pertinent surgical history.  FAMILY HISTORY History reviewed. No pertinent family history.  SOCIAL HISTORY Social History   Tobacco Use  . Smoking status: Never Smoker  . Smokeless tobacco: Never Used  Substance Use Topics  . Alcohol use: Never  Frequency: Never  . Drug use: Never         OPHTHALMIC EXAM:  Base Eye Exam    Visual Acuity (Snellen - Linear)      Right Left   Dist cc 20/30 -2 20/30 -2   Dist ph cc 20/25 + 20/20 -2   Correction:  Glasses       Tonometry (Tonopen, 3:23 PM)      Right Left   Pressure 15 17  Squeezing       Pupils      Dark Light Shape React APD   Right 4 3 Round Brisk None   Left 4 3 Round Brisk None       Visual Fields (Counting fingers)      Left Right    Full Full       Extraocular Movement      Right Left    Full, Ortho Full, Ortho       Neuro/Psych    Oriented x3:  Yes   Mood/Affect:  Normal       Dilation    Both eyes:  1.0% Mydriacyl, 2.5% Phenylephrine @ 3:23  PM        Slit Lamp and Fundus Exam    Slit Lamp Exam      Right Left   Lids/Lashes Dermatochalasis - upper lid Dermatochalasis - upper lid   Conjunctiva/Sclera Melanosis Melanosis   Cornea 1+ Punctate epithelial erosions 1+ Punctate epithelial erosions   Anterior Chamber deep and clear deep and clear   Iris Round and dilated Round and dilated   Lens 1-2+ Nuclear sclerosis, 2+ Cortical cataract 1-2+ Nuclear sclerosis, 2+ Cortical cataract   Vitreous Vitreous syneresis, no tobacco dusting, Posterior vitreous detachment, vitreous condensations settling inferiorly, mild heme clearing Vitreous syneresis       Fundus Exam      Right Left   Disc Pink and Sharp Pink and Sharp, mild temporal Peripapillary atrophy   C/D Ratio 0.3 0.4   Macula Good foveal reflex, Retinal pigment epithelial mottling, No heme or edema Good foveal reflex, mild Retinal pigment epithelial mottling, No heme or edema   Vessels Vascular attenuation, mild Tortuousity Vascular attenuation   Periphery Attached, focal RPE clumping inferiorly at 0700, 360 peripheral cystoid degeneration, VR tuft at 1030 Attached, 360 pigmented peripheral cystoid degeneration          IMAGING AND PROCEDURES  Imaging and Procedures for @TODAY @  OCT, Retina - OU - Both Eyes       Right Eye Quality was good. Central Foveal Thickness: 260. Progression has been stable. Findings include normal foveal contour, no IRF, no SRF.   Left Eye Quality was good. Central Foveal Thickness: 279. Progression has been stable. Findings include normal foveal contour, no SRF, no IRF.   Notes *Images captured and stored on drive  Diagnosis / Impression:  NFP, no IRF/SRF OU   Clinical management:  See below  Abbreviations: NFP - Normal foveal profile. CME - cystoid macular edema. PED - pigment epithelial detachment. IRF - intraretinal fluid. SRF - subretinal fluid. EZ - ellipsoid zone. ERM - epiretinal membrane. ORA - outer retinal atrophy. ORT -  outer retinal tubulation. SRHM - subretinal hyper-reflective material                 ASSESSMENT/PLAN:    ICD-10-CM   1. Posterior vitreous detachment of right eye H43.811   2. Vitreous hemorrhage of right eye (HCC) H43.11   3. Retinal edema H35.81 OCT, Retina -  OU - Both Eyes  4. Combined forms of age-related cataract of both eyes H25.813     1,2. Hemorrhagic PVD OD -- improving  Onset mid Feb 2020 -- presented to Dr. Loleta Chance / [facility]  Mild VH now clearing / improving / settling inferiorly  No RT or RD on 360 scleral depressed exam  Discussed findings and prognosis  Reviewed s/s of RT/RD  Strict return precautions for any such RT/RD signs/symptoms  VH precautions reviewed -- minimize activities, keep head elevated, avoid ASA/NSAIDs/blood thinners as able  F/U 3-4 months  3. No retinal edema on exam or OCT  4. Mixed cataracts OU - The symptoms of cataract, surgical options, and treatments and risks were discussed with patient. - discussed diagnosis and progression - not yet visually significant - monitor for now   Ophthalmic Meds Ordered this visit:  No orders of the defined types were placed in this encounter.      Return for f/u 3-4 months, PVD OD, DFE, OCT.  There are no Patient Instructions on file for this visit.   Explained the diagnoses, plan, and follow up with the patient and they expressed understanding.  Patient expressed understanding of the importance of proper follow up care.   This document serves as a record of services personally performed by Karie Chimera, MD, PhD. It was created on their behalf by Laurian Brim, OA, an ophthalmic assistant. The creation of this record is the provider's dictation and/or activities during the visit.    Electronically signed by: Laurian Brim, OA  03.31.2020 3:59 PM     Karie Chimera, M.D., Ph.D. Diseases & Surgery of the Retina and Vitreous [facility]   I have  reviewed the above documentation for accuracy and completeness, and I agree with the above. Karie Chimera, M.D., Ph.D. 03/14/19 4:00 PM     Abbreviations: M myopia (nearsighted); A astigmatism; H hyperopia (farsighted); P presbyopia; Mrx spectacle prescription;  CTL contact lenses; OD right eye; OS left eye; OU both eyes  XT exotropia; ET esotropia; PEK punctate epithelial keratitis; PEE punctate epithelial erosions; DES dry eye syndrome; MGD meibomian gland dysfunction; ATs artificial tears; PFAT's preservative free artificial tears; NSC nuclear sclerotic cataract; PSC posterior subcapsular cataract; ERM epi-retinal membrane; PVD posterior vitreous detachment; RD retinal detachment; DM diabetes mellitus; DR diabetic retinopathy; NPDR non-proliferative diabetic retinopathy; PDR proliferative diabetic retinopathy; CSME clinically significant macular edema; DME diabetic macular edema; dbh dot blot hemorrhages; CWS cotton wool spot; POAG primary open angle glaucoma; C/D cup-to-disc ratio; HVF humphrey visual field; GVF goldmann visual field; OCT optical coherence tomography; IOP intraocular pressure; BRVO Branch retinal vein occlusion; CRVO central retinal vein occlusion; CRAO central retinal artery occlusion; BRAO branch retinal artery occlusion; RT retinal tear; SB scleral buckle; PPV pars plana vitrectomy; VH Vitreous hemorrhage; PRP panretinal laser photocoagulation; IVK intravitreal kenalog; VMT vitreomacular traction; MH Macular hole;  NVD neovascularization of the disc; NVE neovascularization elsewhere; AREDS age related eye disease study; ARMD age related macular degeneration; POAG primary open angle glaucoma; EBMD epithelial/anterior basement membrane dystrophy; ACIOL anterior chamber intraocular lens; IOL intraocular lens; PCIOL posterior chamber intraocular lens; Phaco/IOL phacoemulsification with intraocular lens placement; PRK photorefractive keratectomy; LASIK laser assisted in situ  keratomileusis; HTN hypertension; DM diabetes mellitus; COPD chronic obstructive pulmonary disease

## 2019-03-09 ENCOUNTER — Other Ambulatory Visit: Payer: Self-pay

## 2019-03-09 ENCOUNTER — Ambulatory Visit (INDEPENDENT_AMBULATORY_CARE_PROVIDER_SITE_OTHER): Payer: Medicare Other | Admitting: Obstetrics & Gynecology

## 2019-03-09 DIAGNOSIS — N939 Abnormal uterine and vaginal bleeding, unspecified: Secondary | ICD-10-CM | POA: Diagnosis not present

## 2019-03-09 NOTE — Progress Notes (Signed)
TELEHEALTH VIRTUAL GYNECOLOGY VISIT ENCOUNTER NOTE  I connected with Guy Sandifer on 03/09/19 at  3:15 PM EDT by telephone at home and verified that I am speaking with the correct person using two identifiers.   I discussed the limitations, risks, security and privacy concerns of performing an evaluation and management service by telephone and the availability of in person appointments. I also discussed with the patient that there may be a patient responsible charge related to this service. The patient expressed understanding and agreed to proceed.   History:  Amy Mullins is a 58 y.o. No obstetric history on file. female being evaluated today for follow up of AUB. She still has monthly periods but when she saw Dr. Macon Large last month, she told her that she had bled for 3 weeks straight. She reports that she is not currently having any bleeding. Dr. Mervyn Skeeters did an [facility] and pathology was benign and TSH and CBC were normal. She did not get the u/s that had been scheduled. She denies any abnormal vaginal discharge, bleeding or other concerns.   She reports normal bowel and bladder function.     Past Medical History:  Diagnosis Date   Chronic back pain    Obesity    Sciatica    No past surgical history on file. The following portions of the patient's history were reviewed and updated as appropriate: allergies, current medications, past family history, past medical history, past social history, past surgical history and problem list.   Health Maintenance:  Normal pap and negative HRHPV on 02/23/19.   Mammogram scheduled for 03/30/19.  Review of Systems:  Pertinent items noted in HPI and remainder of comprehensive ROS otherwise negative.  Physical Exam:  Physical exam deferred due to nature of the encounter  Labs and Imaging Results for orders placed or performed in visit on 02/23/19 (from the past 336 hour(s))  CBC   Collection Time: 02/23/19  4:26 PM  Result Value Ref Range   WBC 10.3 3.4  - 10.8 x10E3/uL   RBC 5.33 (H) 3.77 - 5.28 x10E6/uL   Hemoglobin 11.7 11.1 - 15.9 g/dL   Hematocrit 73.4 28.7 - 46.6 %   MCV 73 (L) 79 - 97 fL   MCH 22.0 (L) 26.6 - 33.0 pg   MCHC 29.9 (L) 31.5 - 35.7 g/dL   RDW 68.1 15.7 - 26.2 %   Platelets 383 150 - 450 x10E3/uL  TSH   Collection Time: 02/23/19  4:26 PM  Result Value Ref Range   TSH 0.921 0.450 - 4.500 uIU/mL   Mr Lumbar Spine Wo Contrast  Result Date: 02/17/2019 CLINICAL DATA:  Bilateral sciatica. EXAM: MRI LUMBAR SPINE WITHOUT CONTRAST TECHNIQUE: Multiplanar, multisequence MR imaging of the lumbar spine was performed. No intravenous contrast was administered. COMPARISON:  None. FINDINGS: Segmentation:  Normal Alignment:  Normal Vertebrae: Negative for fracture or mass. No focal bone marrow lesion. Conus medullaris and cauda equina: Conus extends to the L2 level. Conus and cauda equina appear normal. Paraspinal and other soft tissues: Negative for paraspinous mass or adenopathy Disc levels: T12-L1: Bilateral facet degeneration without stenosis L1-2: Bilateral facet degeneration with normal disc space. Negative for stenosis L2-3: Normal disc space with moderate facet degeneration. Negative for stenosis L3-4: Mild disc degeneration and moderate facet degeneration. Negative for disc protrusion or stenosis L4-5: Diffuse bulging of the disc. Advanced facet degeneration bilaterally. Moderate subarticular and foraminal stenosis bilaterally L5-S1: Moderately large left-sided disc protrusion extending into the foramen and lateral to the foramen with  impingement left S1 nerve root. Severe facet degeneration bilaterally. Severe subarticular and foraminal stenosis on the left. IMPRESSION: Moderately large left-sided disc protrusion L5-S1 with associated severe facet degeneration bilaterally. Severe subarticular foraminal stenosis on the left with impingement left S1 nerve root. Diffuse lumbar facet degeneration. Moderate subarticular and foraminal stenosis  bilaterally L4-5. Electronically Signed   By: Marlan Palau M.D.   On: 02/17/2019 11:02      Assessment and Plan:     There are no diagnoses linked to this encounter.     AUB- the u/s has been rescheduled for 04/05/19 at 1:45 with a full bladder.   I discussed the assessment and treatment plan with the patient. The patient was provided an opportunity to ask questions and all were answered. The patient agreed with the plan and demonstrated an understanding of the instructions.   The patient was advised to call back or seek an in-person evaluation/go to the ED if the symptoms worsen or if the condition fails to improve as anticipated.  I provided 10 minutes of non-face-to-face time during this encounter.   [name], MD [facility] [employer], [facility]

## 2019-03-14 ENCOUNTER — Other Ambulatory Visit: Payer: Self-pay

## 2019-03-14 ENCOUNTER — Encounter (INDEPENDENT_AMBULATORY_CARE_PROVIDER_SITE_OTHER): Payer: Self-pay | Admitting: Ophthalmology

## 2019-03-14 ENCOUNTER — Ambulatory Visit (INDEPENDENT_AMBULATORY_CARE_PROVIDER_SITE_OTHER): Payer: Medicare Other | Admitting: Ophthalmology

## 2019-03-14 DIAGNOSIS — H25813 Combined forms of age-related cataract, bilateral: Secondary | ICD-10-CM

## 2019-03-14 DIAGNOSIS — H43811 Vitreous degeneration, right eye: Secondary | ICD-10-CM

## 2019-03-14 DIAGNOSIS — H4311 Vitreous hemorrhage, right eye: Secondary | ICD-10-CM

## 2019-03-14 DIAGNOSIS — H3581 Retinal edema: Secondary | ICD-10-CM | POA: Diagnosis not present

## 2019-03-30 ENCOUNTER — Ambulatory Visit: Payer: Medicare Other

## 2019-04-05 ENCOUNTER — Ambulatory Visit (HOSPITAL_COMMUNITY): Payer: Medicare Other

## 2019-04-07 ENCOUNTER — Ambulatory Visit (HOSPITAL_COMMUNITY)
Admission: RE | Admit: 2019-04-07 | Discharge: 2019-04-07 | Disposition: A | Discharge status: Home / Self Care | Payer: Medicare Other | Source: Ambulatory Visit | Attending: Obstetrics & Gynecology | Admitting: Obstetrics & Gynecology

## 2019-04-07 ENCOUNTER — Other Ambulatory Visit: Payer: Self-pay

## 2019-04-07 DIAGNOSIS — N939 Abnormal uterine and vaginal bleeding, unspecified: Secondary | ICD-10-CM | POA: Diagnosis not present

## 2019-04-08 ENCOUNTER — Telehealth: Payer: Self-pay | Admitting: *Deleted

## 2019-04-08 NOTE — Telephone Encounter (Signed)
I called [facility] and left a message I am calling with some non- urgent results- she can call us back before 12 today or on Monday.

## 2019-04-08 NOTE — Telephone Encounter (Signed)
-----   Message from Tereso Newcomer, MD sent at 04/07/2019  7:28 PM EDT ----- Pelvic ultrasound showed small fibroids, some chronic hydrosalpinges (dilated tubes). No acute concerning features. No intervention needed. Please call to inform patient of results and recommendations.

## 2019-04-11 NOTE — Telephone Encounter (Signed)
Called pt regarding results of pelvic ultrasound.  Pt did not pick up.  Left voicemail advising pt that she was being contacted regarding results and requesting she contact the office.  Letter sent.

## 2019-04-12 ENCOUNTER — Telehealth (INDEPENDENT_AMBULATORY_CARE_PROVIDER_SITE_OTHER): Payer: Medicare Other | Admitting: Emergency Medicine

## 2019-04-12 DIAGNOSIS — Z712 Person consulting for explanation of examination or test findings: Secondary | ICD-10-CM

## 2019-04-12 NOTE — Telephone Encounter (Signed)
Pt called voicemail line and stated that she is having vaginal pain and referenced test results from March. Pt requested a medication be sent to her pharmacy.

## 2019-04-13 NOTE — Telephone Encounter (Signed)
Pt returned call and asked about her results.  I informed pt that per chart review she was informed of results and the need to pick up Rx for BV.  Pt stated that she does remembering picking up medication.  I advised pt that if she has questions that she should schedule an appt.  Pt verbalized understanding with no further questions.

## 2019-04-13 NOTE — Telephone Encounter (Signed)
LM that I am returning her call if she continues to have concerns or questions to please call the office.

## 2019-04-18 ENCOUNTER — Telehealth: Payer: Self-pay | Admitting: Lactation Services

## 2019-04-18 NOTE — Telephone Encounter (Signed)
Pt called and left message on nurse voicemail. She would like to get the results of her ultrasound.   Attempted to call pt at 11:22, There was no answer. Left message for pt to call the office at her earliest convenience.

## 2019-04-21 ENCOUNTER — Other Ambulatory Visit: Payer: Self-pay

## 2019-04-21 ENCOUNTER — Other Ambulatory Visit: Payer: Self-pay | Admitting: General Practice

## 2019-04-21 ENCOUNTER — Ambulatory Visit (INDEPENDENT_AMBULATORY_CARE_PROVIDER_SITE_OTHER): Payer: Medicare Other | Admitting: General Practice

## 2019-04-21 DIAGNOSIS — N760 Acute vaginitis: Secondary | ICD-10-CM

## 2019-04-21 DIAGNOSIS — B9689 Other specified bacterial agents as the cause of diseases classified elsewhere: Secondary | ICD-10-CM

## 2019-04-21 DIAGNOSIS — Z712 Person consulting for explanation of examination or test findings: Secondary | ICD-10-CM

## 2019-04-21 MED ORDER — METRONIDAZOLE 500 MG PO TABS: 500.0000 mg | ORAL_TABLET | Freq: Two times a day (BID) | ORAL | 0 refills | Status: DC

## 2019-04-21 NOTE — Progress Notes (Signed)
Patient came by office requesting ultrasound results. States she has missed several phone calls from [place] and was concerned. Informed patient of results per Dr Macon Large. Patient verbalized understanding & states she is having the same problem she was back in March with the odor. Asked patient if she took antibiotic Rx and she states yes. Informed patient of refill sent to pharmacy she can take and provided instructions. Patient verbalized understanding and states she still feels that lump inside her vagina and is worried about it. Per chart review, Patient saw Dr Macon Large in March and was noted to have a cervical lesion that could potentially have a biopsy at a later point. Patient seems very concerned about lesion. Advised she make a follow up appt with Dr Macon Large. Patient verbalized understanding & had no questions.  Chase Caller RN BSN 04/21/19

## 2019-04-22 NOTE — Progress Notes (Signed)
I have reviewed the chart and agree with nursing staff's documentation of this patient's encounter.  Jaynie Collins, MD 04/22/2019 7:42 AM

## 2019-05-11 ENCOUNTER — Encounter: Payer: Self-pay | Admitting: Gastroenterology

## 2019-05-17 ENCOUNTER — Ambulatory Visit: Payer: Medicare Other

## 2019-05-27 ENCOUNTER — Encounter: Payer: Self-pay | Admitting: Obstetrics & Gynecology

## 2019-05-27 ENCOUNTER — Ambulatory Visit: Payer: Medicare Other | Admitting: Obstetrics & Gynecology

## 2019-06-02 ENCOUNTER — Telehealth: Payer: Self-pay | Admitting: *Deleted

## 2019-06-02 NOTE — Telephone Encounter (Signed)
Can be direct book for [facility], but we have patients requiring hospital procedures that have been put off for last few months due to [place].  I will copy this to nursing so this patient can be put in queue for scheduling.  - HD

## 2019-06-02 NOTE — Telephone Encounter (Signed)
Dr Loletha Carrow,  This pt is scheduled for direct colon with you on 06-28-19 Tuesday for a screening colonoscopy.  Her last colon was 10+ years ago in [place].  She has no GI hx with [facility]. Her BMI is 55.44, with a weight of 354.0lb.   Do you want her to have an OV or can she be direct at [facility]? Please advise Thanks for your time, Marijean Niemann

## 2019-06-02 NOTE — Telephone Encounter (Signed)
I called Amy Mullins and ex[plained that her procdure will have to be at hospital due to BMI 55.44- she verbalized understanding- I told her we will cal her to RS colon at [facility] date and PV date later- she understands- I cancelled her [place] colon and pV 7-7

## 2019-06-03 ENCOUNTER — Inpatient Hospital Stay (HOSPITAL_COMMUNITY)
Admission: EM | Admit: 2019-06-03 | Discharge: 2019-06-07 | DRG: 029 | Disposition: A | Discharge status: Rehabilitation Facility | Payer: No Typology Code available for payment source | Attending: Neurological Surgery | Admitting: Neurological Surgery

## 2019-06-03 ENCOUNTER — Emergency Department (HOSPITAL_COMMUNITY): Payer: No Typology Code available for payment source

## 2019-06-03 DIAGNOSIS — Z6841 Body Mass Index (BMI) 40.0 and over, adult: Secondary | ICD-10-CM

## 2019-06-03 DIAGNOSIS — G9529 Other cord compression: Secondary | ICD-10-CM | POA: Diagnosis present

## 2019-06-03 DIAGNOSIS — H43811 Vitreous degeneration, right eye: Secondary | ICD-10-CM | POA: Diagnosis present

## 2019-06-03 DIAGNOSIS — Z79899 Other long term (current) drug therapy: Secondary | ICD-10-CM | POA: Diagnosis not present

## 2019-06-03 DIAGNOSIS — R5082 Postprocedural fever: Secondary | ICD-10-CM | POA: Diagnosis present

## 2019-06-03 DIAGNOSIS — S060X9D Concussion with loss of consciousness of unspecified duration, subsequent encounter: Secondary | ICD-10-CM | POA: Diagnosis not present

## 2019-06-03 DIAGNOSIS — S14129S Central cord syndrome at unspecified level of cervical spinal cord, sequela: Secondary | ICD-10-CM | POA: Diagnosis not present

## 2019-06-03 DIAGNOSIS — S81021A Laceration with foreign body, right knee, initial encounter: Secondary | ICD-10-CM | POA: Diagnosis present

## 2019-06-03 DIAGNOSIS — S71022A Laceration with foreign body, left hip, initial encounter: Secondary | ICD-10-CM | POA: Diagnosis present

## 2019-06-03 DIAGNOSIS — T1490XA Injury, unspecified, initial encounter: Secondary | ICD-10-CM

## 2019-06-03 DIAGNOSIS — S13161A Dislocation of C5/C6 cervical vertebrae, initial encounter: Secondary | ICD-10-CM | POA: Diagnosis present

## 2019-06-03 DIAGNOSIS — M4802 Spinal stenosis, cervical region: Secondary | ICD-10-CM | POA: Diagnosis present

## 2019-06-03 DIAGNOSIS — B9620 Unspecified Escherichia coli [E. coli] as the cause of diseases classified elsewhere: Secondary | ICD-10-CM | POA: Diagnosis present

## 2019-06-03 DIAGNOSIS — Z7982 Long term (current) use of aspirin: Secondary | ICD-10-CM | POA: Diagnosis not present

## 2019-06-03 DIAGNOSIS — S71012A Laceration without foreign body, left hip, initial encounter: Secondary | ICD-10-CM

## 2019-06-03 DIAGNOSIS — S069X0A Unspecified intracranial injury without loss of consciousness, initial encounter: Secondary | ICD-10-CM | POA: Diagnosis not present

## 2019-06-03 DIAGNOSIS — Z1159 Encounter for screening for other viral diseases: Secondary | ICD-10-CM | POA: Diagnosis not present

## 2019-06-03 DIAGNOSIS — M4712 Other spondylosis with myelopathy, cervical region: Secondary | ICD-10-CM | POA: Diagnosis present

## 2019-06-03 DIAGNOSIS — R509 Fever, unspecified: Secondary | ICD-10-CM | POA: Diagnosis not present

## 2019-06-03 DIAGNOSIS — H8110 Benign paroxysmal vertigo, unspecified ear: Secondary | ICD-10-CM | POA: Diagnosis present

## 2019-06-03 DIAGNOSIS — I82441 Acute embolism and thrombosis of right tibial vein: Secondary | ICD-10-CM | POA: Diagnosis not present

## 2019-06-03 DIAGNOSIS — Z79891 Long term (current) use of opiate analgesic: Secondary | ICD-10-CM | POA: Diagnosis not present

## 2019-06-03 DIAGNOSIS — S069X0S Unspecified intracranial injury without loss of consciousness, sequela: Secondary | ICD-10-CM | POA: Diagnosis not present

## 2019-06-03 DIAGNOSIS — Z981 Arthrodesis status: Secondary | ICD-10-CM | POA: Diagnosis not present

## 2019-06-03 DIAGNOSIS — S14125A Central cord syndrome at C5 level of cervical spinal cord, initial encounter: Secondary | ICD-10-CM | POA: Diagnosis present

## 2019-06-03 DIAGNOSIS — Z419 Encounter for procedure for purposes other than remedying health state, unspecified: Secondary | ICD-10-CM

## 2019-06-03 DIAGNOSIS — Y92410 Unspecified street and highway as the place of occurrence of the external cause: Secondary | ICD-10-CM | POA: Diagnosis not present

## 2019-06-03 DIAGNOSIS — S14129D Central cord syndrome at unspecified level of cervical spinal cord, subsequent encounter: Secondary | ICD-10-CM | POA: Diagnosis not present

## 2019-06-03 DIAGNOSIS — S14129A Central cord syndrome at unspecified level of cervical spinal cord, initial encounter: Secondary | ICD-10-CM | POA: Diagnosis present

## 2019-06-03 DIAGNOSIS — S14109S Unspecified injury at unspecified level of cervical spinal cord, sequela: Secondary | ICD-10-CM | POA: Diagnosis not present

## 2019-06-03 DIAGNOSIS — F17210 Nicotine dependence, cigarettes, uncomplicated: Secondary | ICD-10-CM | POA: Diagnosis present

## 2019-06-03 DIAGNOSIS — N390 Urinary tract infection, site not specified: Secondary | ICD-10-CM | POA: Diagnosis present

## 2019-06-03 DIAGNOSIS — I82409 Acute embolism and thrombosis of unspecified deep veins of unspecified lower extremity: Secondary | ICD-10-CM | POA: Diagnosis not present

## 2019-06-03 DIAGNOSIS — D62 Acute posthemorrhagic anemia: Secondary | ICD-10-CM | POA: Diagnosis present

## 2019-06-03 DIAGNOSIS — Z8042 Family history of malignant neoplasm of prostate: Secondary | ICD-10-CM | POA: Diagnosis not present

## 2019-06-03 DIAGNOSIS — S81011A Laceration without foreign body, right knee, initial encounter: Secondary | ICD-10-CM

## 2019-06-03 DIAGNOSIS — M5432 Sciatica, left side: Secondary | ICD-10-CM | POA: Diagnosis present

## 2019-06-03 DIAGNOSIS — G8929 Other chronic pain: Secondary | ICD-10-CM | POA: Diagnosis present

## 2019-06-03 DIAGNOSIS — H8113 Benign paroxysmal vertigo, bilateral: Secondary | ICD-10-CM | POA: Diagnosis not present

## 2019-06-03 HISTORY — DX: Vitreous degeneration, right eye: H43.811

## 2019-06-03 HISTORY — DX: Abnormal uterine and vaginal bleeding, unspecified: N93.9

## 2019-06-03 HISTORY — DX: Sciatica, left side: M54.32

## 2019-06-03 HISTORY — DX: Unilateral primary osteoarthritis, right knee: M17.11

## 2019-06-03 LAB — COMPREHENSIVE METABOLIC PANEL
ALT: 23 U/L (ref 0–44)
AST: 30 U/L (ref 15–41)
Albumin: 3.8 g/dL (ref 3.5–5.0)
Alkaline Phosphatase: 60 U/L (ref 38–126)
Anion gap: 9 (ref 5–15)
BUN: 11 mg/dL (ref 6–20)
CO2: 22 mmol/L (ref 22–32)
Calcium: 9.3 mg/dL (ref 8.9–10.3)
Chloride: 107 mmol/L (ref 98–111)
Creatinine, Ser: 0.78 mg/dL (ref 0.44–1.00)
GFR calc Af Amer: >60 mL/min (ref >60)
GFR calc non Af Amer: >60 mL/min (ref >60)
Glucose, Bld: 158 mg/dL — ABNORMAL HIGH (ref 70–99)
Potassium: 4 mmol/L (ref 3.5–5.1)
Sodium: 138 mmol/L (ref 135–145)
Total Bilirubin: 1 mg/dL (ref 0.3–1.2)
Total Protein: 7.4 g/dL (ref 6.5–8.1)

## 2019-06-03 LAB — CBC
HCT: 39.4 % (ref 36.0–46.0)
Hemoglobin: 11.9 g/dL — ABNORMAL LOW (ref 12.0–15.0)
MCH: 21.7 pg — ABNORMAL LOW (ref 26.0–34.0)
MCHC: 30.2 g/dL (ref 30.0–36.0)
MCV: 71.9 fL — ABNORMAL LOW (ref 80.0–100.0)
Platelets: 296 10*3/uL (ref 150–400)
RBC: 5.48 MIL/uL — ABNORMAL HIGH (ref 3.87–5.11)
RDW: 15 % (ref 11.5–15.5)
WBC: 7.5 10*3/uL (ref 4.0–10.5)
nRBC: 0 % (ref 0.0–0.2)

## 2019-06-03 LAB — I-STAT CHEM 8, ED
BUN: 11 mg/dL (ref 6–20)
Calcium, Ion: 1.19 mmol/L (ref 1.15–1.40)
Chloride: 106 mmol/L (ref 98–111)
Creatinine, Ser: 0.7 mg/dL (ref 0.44–1.00)
Glucose, Bld: 155 mg/dL — ABNORMAL HIGH (ref 70–99)
HCT: 42 % (ref 36.0–46.0)
Hemoglobin: 14.3 g/dL (ref 12.0–15.0)
Potassium: 3.9 mmol/L (ref 3.5–5.1)
Sodium: 139 mmol/L (ref 135–145)
TCO2: 24 mmol/L (ref 22–32)

## 2019-06-03 LAB — SARS CORONAVIRUS 2 BY RT PCR (HOSPITAL ORDER, PERFORMED IN ~~LOC~~ HOSPITAL LAB): SARS Coronavirus 2: NEGATIVE

## 2019-06-03 LAB — PROTIME-INR
INR: 1.1 (ref 0.8–1.2)
Prothrombin Time: 13.6 seconds (ref 11.4–15.2)

## 2019-06-03 LAB — SAMPLE TO BLOOD BANK

## 2019-06-03 LAB — ETHANOL: Alcohol, Ethyl (B): <10 mg/dL (ref <10)

## 2019-06-03 LAB — CDS SEROLOGY

## 2019-06-03 LAB — LACTIC ACID, PLASMA: Lactic Acid, Venous: 1.9 mmol/L (ref 0.5–1.9)

## 2019-06-03 MED ORDER — HYDROMORPHONE HCL 1 MG/ML IJ SOLN
1.0000 mg | Freq: Once | INTRAMUSCULAR | Status: AC
  Administered 2019-06-03: 1 mg via INTRAVENOUS
  Filled 2019-06-03: qty 1

## 2019-06-03 MED ORDER — IOPAMIDOL (ISOVUE-300) INJECTION 61%
100.0000 mL | Freq: Once | INTRAVENOUS | Status: AC | PRN
  Administered 2019-06-03: 100 mL via INTRAVENOUS

## 2019-06-03 MED ORDER — LIDOCAINE-EPINEPHRINE (PF) 2 %-1:200000 IJ SOLN
20.0000 mL | Freq: Once | INTRAMUSCULAR | Status: AC
  Administered 2019-06-03: 20 mL via INTRADERMAL
  Filled 2019-06-03: qty 20

## 2019-06-03 MED ORDER — LACTATED RINGERS IV SOLN
INTRAVENOUS | Status: AC
  Administered 2019-06-04: 02:00:00 via INTRAVENOUS

## 2019-06-03 MED ORDER — SODIUM CHLORIDE 0.9 % IV BOLUS
1000.0000 mL | Freq: Once | INTRAVENOUS | Status: AC
  Administered 2019-06-03: 1000 mL via INTRAVENOUS

## 2019-06-03 MED ORDER — HYDROMORPHONE HCL 1 MG/ML IJ SOLN
0.5000 mg | INTRAMUSCULAR | Status: DC | PRN
  Administered 2019-06-04: 0.5 mg via INTRAVENOUS
  Filled 2019-06-03 (×2): qty 1

## 2019-06-03 MED ORDER — BACITRACIN ZINC 500 UNIT/GM EX OINT
TOPICAL_OINTMENT | Freq: Once | CUTANEOUS | Status: AC
  Administered 2019-06-03: 1 via TOPICAL
  Filled 2019-06-03: qty 0.9
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
## 2019-06-03 NOTE — TOC Transition Note (Signed)
Transition of Care [facility]) - CM/SW Discharge Note   Patient Details  Name: Amy Mullins MRN: [id number] Date of Birth: [birth year]  [facility]) CM/SW Contact:  Fuller Mandril, RN Phone Number: 06/03/2019, 1:31 PM   Clinical Narrative:     Fuller Mandril, RN, BSN, NCM [phone] Pt qualifies for DME rolling walker.  DME  ordered through [facility].  [facility] [place] notified to deliver rolling walker to pt room prior to D/C home.    Final next level of care: [place] Barriers to Discharge: Equipment Delay   Patient Goals and CMS Choice Patient states their goals for this hospitalization and ongoing recovery are:: get home CMS Medicare.gov Compare Post Acute Care list provided to:: Patient Choice offered to / list presented to : Patient  Discharge Placement                       Discharge Plan and Services   Discharge Planning Services: CM Consult Post Acute Care Choice: Durable Medical Equipment          DME Arranged: Gilford Rile rolling DME Agency: [employer] Date DME Agency Contacted: 06/03/19 Time DME Agency Contacted: 58 Representative spoke with at DME Agency: [place] ([place]) Interventions     Readmission Risk Interventions No flowsheet data found.

## 2019-06-03 NOTE — ED Notes (Signed)
  Patient lives with daughter Onalee Hua, she does not know her phone number.  Daughter: August Luz  [phone]  Niece: [phone]

## 2019-06-03 NOTE — ED Notes (Addendum)
Tried to walk pt, pt too dizzy and in too much pain. MD notified
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
## 2019-06-03 NOTE — ED Notes (Signed)
Pt given [place] sandwich and sprite per Kathrynn Humble, MD.

## 2019-06-03 NOTE — ED Provider Notes (Signed)
    Durable Medical Equipment  (From admission, onward)         Start     Ordered   06/03/19 1316  For home use only DME Walker rolling  Once    Question:  Patient needs a walker to treat with the following condition  Answer:  Leg weakness, bilateral   06/03/19 1315           Sherwood Gambler, MD 06/03/19 1315

## 2019-06-03 NOTE — Progress Notes (Signed)
CSW received call from patient's daughter, Onalee Hua, wanting to know what is going on with her mom. Her call back number is [phone].   Golden Circle, LCSW Transitions of Care Department [facility] ED [phone]
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
## 2019-06-03 NOTE — ED Notes (Signed)
Patient transported to MRI 
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
## 2019-06-04 ENCOUNTER — Encounter (HOSPITAL_COMMUNITY): Payer: Self-pay | Admitting: Anesthesiology

## 2019-06-04 ENCOUNTER — Inpatient Hospital Stay: Admit: 2019-06-04 | Payer: Medicare Other | Admitting: Neurological Surgery

## 2019-06-04 ENCOUNTER — Encounter (HOSPITAL_COMMUNITY)
Admission: EM | Disposition: A | Discharge status: Rehabilitation Facility | Payer: Self-pay | Source: Home / Self Care | Attending: Neurological Surgery

## 2019-06-04 ENCOUNTER — Inpatient Hospital Stay (HOSPITAL_COMMUNITY): Payer: No Typology Code available for payment source

## 2019-06-04 ENCOUNTER — Inpatient Hospital Stay (HOSPITAL_COMMUNITY): Payer: No Typology Code available for payment source | Admitting: Certified Registered"

## 2019-06-04 DIAGNOSIS — S14129A Central cord syndrome at unspecified level of cervical spinal cord, initial encounter: Secondary | ICD-10-CM | POA: Diagnosis present

## 2019-06-04 HISTORY — PX: ANTERIOR CERVICAL DECOMP/DISCECTOMY FUSION: SHX1161

## 2019-06-04 LAB — URINALYSIS, ROUTINE W REFLEX MICROSCOPIC
Bilirubin Urine: NEGATIVE
Glucose, UA: NEGATIVE mg/dL
Hgb urine dipstick: NEGATIVE
Ketones, ur: NEGATIVE mg/dL
Nitrite: POSITIVE — AB
Protein, ur: 30 mg/dL — AB
Specific Gravity, Urine: >1.046 — ABNORMAL HIGH (ref 1.005–1.030)
pH: 5 (ref 5.0–8.0)

## 2019-06-04 LAB — SURGICAL PCR SCREEN
MRSA, PCR: NEGATIVE
Staphylococcus aureus: NEGATIVE

## 2019-06-04 SURGERY — ANTERIOR CERVICAL DECOMPRESSION/DISCECTOMY FUSION 1 LEVEL
Anesthesia: General | Site: Spine Cervical

## 2019-06-04 SURGERY — ANTERIOR CERVICAL DECOMPRESSION/DISCECTOMY FUSION 1 LEVEL
Anesthesia: General

## 2019-06-04 MED ORDER — CEFAZOLIN SODIUM-DEXTROSE 2-4 GM/100ML-% IV SOLN
2.0000 g | Freq: Three times a day (TID) | INTRAVENOUS | Status: AC
  Administered 2019-06-04 – 2019-06-05 (×2): 2 g via INTRAVENOUS
  Filled 2019-06-04 (×2): qty 100

## 2019-06-04 MED ORDER — ACETAMINOPHEN 325 MG PO TABS
650.0000 mg | ORAL_TABLET | ORAL | Status: DC | PRN
  Administered 2019-06-06: 650 mg via ORAL
  Filled 2019-06-04: qty 2

## 2019-06-04 MED ORDER — ACETAMINOPHEN 10 MG/ML IV SOLN
INTRAVENOUS | Status: DC | PRN
  Administered 2019-06-04: 1000 mg via INTRAVENOUS

## 2019-06-04 MED ORDER — PROPOFOL 10 MG/ML IV BOLUS
INTRAVENOUS | Status: DC | PRN
  Administered 2019-06-04: 100 mg via INTRAVENOUS

## 2019-06-04 MED ORDER — LIDOCAINE-EPINEPHRINE 1 %-1:100000 IJ SOLN
INTRAMUSCULAR | Status: AC
  Filled 2019-06-04: qty 1

## 2019-06-04 MED ORDER — DOCUSATE SODIUM 100 MG PO CAPS
100.0000 mg | ORAL_CAPSULE | Freq: Two times a day (BID) | ORAL | Status: DC
  Administered 2019-06-05 – 2019-06-07 (×5): 100 mg via ORAL
  Filled 2019-06-04 (×5): qty 1

## 2019-06-04 MED ORDER — CYCLOBENZAPRINE HCL 10 MG PO TABS
10.0000 mg | ORAL_TABLET | Freq: Three times a day (TID) | ORAL | Status: DC | PRN
  Administered 2019-06-05 – 2019-06-07 (×6): 10 mg via ORAL
  Filled 2019-06-04 (×6): qty 1

## 2019-06-04 MED ORDER — FENTANYL CITRATE (PF) 250 MCG/5ML IJ SOLN
INTRAMUSCULAR | Status: AC
  Filled 2019-06-04: qty 5

## 2019-06-04 MED ORDER — MIDAZOLAM HCL 5 MG/5ML IJ SOLN
INTRAMUSCULAR | Status: DC | PRN
  Administered 2019-06-04: 2 mg via INTRAVENOUS

## 2019-06-04 MED ORDER — OXYCODONE HCL 5 MG PO TABS
10.0000 mg | ORAL_TABLET | ORAL | Status: DC | PRN
  Administered 2019-06-04 – 2019-06-07 (×10): 10 mg via ORAL
  Filled 2019-06-04 (×10): qty 2

## 2019-06-04 MED ORDER — THROMBIN 5000 UNITS EX SOLR
CUTANEOUS | Status: AC
  Filled 2019-06-04: qty 5000

## 2019-06-04 MED ORDER — SODIUM CHLORIDE 0.9 % IV SOLN
250.0000 mL | INTRAVENOUS | Status: DC
  Administered 2019-06-04: 250 mL via INTRAVENOUS

## 2019-06-04 MED ORDER — SODIUM CHLORIDE 0.9% FLUSH
3.0000 mL | Freq: Two times a day (BID) | INTRAVENOUS | Status: DC
  Administered 2019-06-04 – 2019-06-07 (×5): 3 mL via INTRAVENOUS

## 2019-06-04 MED ORDER — 0.9 % SODIUM CHLORIDE (POUR BTL) OPTIME
TOPICAL | Status: DC | PRN
  Administered 2019-06-04: 1000 mL

## 2019-06-04 MED ORDER — SODIUM CHLORIDE 0.9 % IV SOLN
INTRAVENOUS | Status: DC | PRN
  Administered 2019-06-04: 500 mL

## 2019-06-04 MED ORDER — ROCURONIUM BROMIDE 10 MG/ML (PF) SYRINGE
PREFILLED_SYRINGE | INTRAVENOUS | Status: DC | PRN
  Administered 2019-06-04: 100 mg via INTRAVENOUS

## 2019-06-04 MED ORDER — THROMBIN 5000 UNITS EX SOLR
OROMUCOSAL | Status: DC | PRN
  Administered 2019-06-04: 5 mL via TOPICAL

## 2019-06-04 MED ORDER — PHENOL 1.4 % MT LIQD: 1.0000 | OROMUCOSAL | Status: DC | PRN

## 2019-06-04 MED ORDER — POLYETHYLENE GLYCOL 3350 17 G PO PACK: 17.0000 g | PACK | Freq: Every day | ORAL | Status: DC | PRN

## 2019-06-04 MED ORDER — ACETAMINOPHEN 650 MG RE SUPP: 650.0000 mg | RECTAL | Status: DC | PRN

## 2019-06-04 MED ORDER — ONDANSETRON HCL 4 MG/2ML IJ SOLN: 4.0000 mg | Freq: Four times a day (QID) | INTRAMUSCULAR | Status: DC | PRN

## 2019-06-04 MED ORDER — SODIUM CHLORIDE 0.9 % IV SOLN
INTRAVENOUS | Status: DC | PRN
  Administered 2019-06-04: 13:00:00 25 ug/min via INTRAVENOUS

## 2019-06-04 MED ORDER — PROPOFOL 10 MG/ML IV BOLUS
INTRAVENOUS | Status: AC
  Filled 2019-06-04: qty 20

## 2019-06-04 MED ORDER — ACETAMINOPHEN 10 MG/ML IV SOLN: 1000.0000 mg | Freq: Once | INTRAVENOUS | Status: DC | PRN

## 2019-06-04 MED ORDER — HYDROMORPHONE HCL 1 MG/ML IJ SOLN
1.0000 mg | INTRAMUSCULAR | Status: DC | PRN
  Administered 2019-06-04: 1 mg via INTRAVENOUS
  Filled 2019-06-04: qty 1

## 2019-06-04 MED ORDER — HYDROMORPHONE HCL 1 MG/ML IJ SOLN: 0.2500 mg | INTRAMUSCULAR | Status: DC | PRN

## 2019-06-04 MED ORDER — CEFAZOLIN SODIUM 1 G IJ SOLR
INTRAMUSCULAR | Status: AC
  Filled 2019-06-04: qty 20

## 2019-06-04 MED ORDER — LIDOCAINE-EPINEPHRINE 1 %-1:100000 IJ SOLN
INTRAMUSCULAR | Status: DC | PRN
  Administered 2019-06-04: 6 mL

## 2019-06-04 MED ORDER — DEXTROSE 5 % IV SOLN
3.0000 g | Freq: Once | INTRAVENOUS | Status: AC
  Administered 2019-06-04: 3 g via INTRAVENOUS
  Filled 2019-06-04: qty 3000

## 2019-06-04 MED ORDER — SUCCINYLCHOLINE CHLORIDE 200 MG/10ML IV SOSY
PREFILLED_SYRINGE | INTRAVENOUS | Status: DC | PRN
  Administered 2019-06-04: 140 mg via INTRAVENOUS

## 2019-06-04 MED ORDER — OXYCODONE HCL 5 MG PO TABS: 5.0000 mg | ORAL_TABLET | Freq: Once | ORAL | Status: DC | PRN

## 2019-06-04 MED ORDER — HYDROMORPHONE HCL 1 MG/ML IJ SOLN: 0.5000 mg | INTRAMUSCULAR | Status: DC | PRN

## 2019-06-04 MED ORDER — INSULIN REGULAR HUMAN 100 UNIT/ML IJ SOLN
5.0000 [IU] | Freq: Once | INTRAMUSCULAR | Status: DC
  Filled 2019-06-04: qty 10

## 2019-06-04 MED ORDER — ESMOLOL HCL 100 MG/10ML IV SOLN
INTRAVENOUS | Status: AC
  Filled 2019-06-04: qty 20

## 2019-06-04 MED ORDER — PROMETHAZINE HCL 25 MG/ML IJ SOLN: 6.2500 mg | INTRAMUSCULAR | Status: DC | PRN

## 2019-06-04 MED ORDER — GABAPENTIN 400 MG PO CAPS
800.0000 mg | ORAL_CAPSULE | Freq: Every day | ORAL | Status: DC
  Administered 2019-06-04 – 2019-06-06 (×3): 800 mg via ORAL
  Filled 2019-06-04 (×3): qty 2

## 2019-06-04 MED ORDER — OXYCODONE HCL 5 MG/5ML PO SOLN: 5.0000 mg | Freq: Once | ORAL | Status: DC | PRN

## 2019-06-04 MED ORDER — ONDANSETRON HCL 4 MG/2ML IJ SOLN
INTRAMUSCULAR | Status: DC | PRN
  Administered 2019-06-04: 4 mg via INTRAVENOUS

## 2019-06-04 MED ORDER — LIDOCAINE 2% (20 MG/ML) 5 ML SYRINGE
INTRAMUSCULAR | Status: DC | PRN
  Administered 2019-06-04: 60 mg via INTRAVENOUS

## 2019-06-04 MED ORDER — ONDANSETRON HCL 4 MG PO TABS: 4.0000 mg | ORAL_TABLET | Freq: Four times a day (QID) | ORAL | Status: DC | PRN

## 2019-06-04 MED ORDER — OXYCODONE HCL 5 MG PO TABS: 5.0000 mg | ORAL_TABLET | ORAL | Status: DC | PRN

## 2019-06-04 MED ORDER — SODIUM CHLORIDE 0.9% FLUSH: 3.0000 mL | INTRAVENOUS | Status: DC | PRN

## 2019-06-04 MED ORDER — FENTANYL CITRATE (PF) 100 MCG/2ML IJ SOLN
INTRAMUSCULAR | Status: DC | PRN
  Administered 2019-06-04: 50 ug via INTRAVENOUS
  Administered 2019-06-04: 100 ug via INTRAVENOUS
  Administered 2019-06-04 (×3): 50 ug via INTRAVENOUS

## 2019-06-04 MED ORDER — PROPOFOL 10 MG/ML IV BOLUS
INTRAVENOUS | Status: AC
  Filled 2019-06-04: qty 40

## 2019-06-04 MED ORDER — TRAZODONE HCL 50 MG PO TABS: 50.0000 mg | ORAL_TABLET | Freq: Every evening | ORAL | Status: DC | PRN

## 2019-06-04 MED ORDER — MENTHOL 3 MG MT LOZG
1.0000 | LOZENGE | OROMUCOSAL | Status: DC | PRN
  Administered 2019-06-06: 3 mg via ORAL
  Filled 2019-06-04: qty 9

## 2019-06-04 MED ORDER — MIDAZOLAM HCL 2 MG/2ML IJ SOLN
INTRAMUSCULAR | Status: AC
  Filled 2019-06-04: qty 2

## 2019-06-04 SURGICAL SUPPLY — 54 items
BAG DECANTER FOR FLEXI CONT (MISCELLANEOUS) ×2 IMPLANT
BENZOIN TINCTURE PRP APPL 2/3 (GAUZE/BANDAGES/DRESSINGS) IMPLANT
BLADE CLIPPER SURG (BLADE) IMPLANT
BLADE SURG 11 STRL SS (BLADE) ×2 IMPLANT
BUR MATCHSTICK NEURO 3.0 LAGG (BURR) ×2 IMPLANT
CANISTER SUCT 3000ML PPV (MISCELLANEOUS) ×2 IMPLANT
COVER WAND RF STERILE (DRAPES) IMPLANT
DECANTER SPIKE VIAL GLASS SM (MISCELLANEOUS) ×2 IMPLANT
DERMABOND ADVANCED (GAUZE/BANDAGES/DRESSINGS) ×1
DERMABOND ADVANCED .7 DNX12 (GAUZE/BANDAGES/DRESSINGS) ×1 IMPLANT
DRAPE C-ARM 42X72 X-RAY (DRAPES) ×4 IMPLANT
DRAPE HALF SHEET 40X57 (DRAPES) IMPLANT
DRAPE LAPAROTOMY 100X72 PEDS (DRAPES) ×2 IMPLANT
DRAPE MICROSCOPE LEICA (MISCELLANEOUS) ×2 IMPLANT
DURAPREP 6ML APPLICATOR 50/CS (WOUND CARE) ×2 IMPLANT
ELECT COATED BLADE 2.86 ST (ELECTRODE) ×2 IMPLANT
ELECT REM PT RETURN 9FT ADLT (ELECTROSURGICAL) ×2
ELECTRODE REM PT RTRN 9FT ADLT (ELECTROSURGICAL) ×1 IMPLANT
GAUZE 4X4 16PLY RFD (DISPOSABLE) IMPLANT
GLOVE BIO SURGEON STRL SZ7.5 (GLOVE) ×2 IMPLANT
GLOVE BIOGEL PI IND STRL 7.0 (GLOVE) ×6 IMPLANT
GLOVE BIOGEL PI IND STRL 7.5 (GLOVE) ×4 IMPLANT
GLOVE BIOGEL PI INDICATOR 7.0 (GLOVE) ×6
GLOVE BIOGEL PI INDICATOR 7.5 (GLOVE) ×4
GLOVE EXAM NITRILE LRG STRL (GLOVE) IMPLANT
GLOVE EXAM NITRILE XL STR (GLOVE) IMPLANT
GLOVE EXAM NITRILE XS STR PU (GLOVE) IMPLANT
GOWN STRL REUS W/ TWL LRG LVL3 (GOWN DISPOSABLE) ×2 IMPLANT
GOWN STRL REUS W/ TWL XL LVL3 (GOWN DISPOSABLE) ×2 IMPLANT
GOWN STRL REUS W/TWL 2XL LVL3 (GOWN DISPOSABLE) IMPLANT
GOWN STRL REUS W/TWL LRG LVL3 (GOWN DISPOSABLE) ×2
GOWN STRL REUS W/TWL XL LVL3 (GOWN DISPOSABLE) ×2
HEMOSTAT POWDER KIT SURGIFOAM (HEMOSTASIS) ×2 IMPLANT
KIT BASIN OR (CUSTOM PROCEDURE TRAY) ×2 IMPLANT
KIT TURNOVER KIT B (KITS) ×2 IMPLANT
NEEDLE HYPO 22GX1.5 SAFETY (NEEDLE) ×2 IMPLANT
NEEDLE SPNL 18GX3.5 QUINCKE PK (NEEDLE) ×2 IMPLANT
NS IRRIG 1000ML POUR BTL (IV SOLUTION) ×2 IMPLANT
PACK LAMINECTOMY NEURO (CUSTOM PROCEDURE TRAY) ×2 IMPLANT
PAD ARMBOARD 7.5X6 YLW CONV (MISCELLANEOUS) ×8 IMPLANT
PIN DISTRACTION 14MM (PIN) ×4 IMPLANT
PLATE 23MM (Plate) ×2 IMPLANT
RUBBERBAND STERILE (MISCELLANEOUS) ×4 IMPLANT
SCREW SELF TAP VAR 4.0X13 (Screw) ×8 IMPLANT
SPACER BONE CORNERSTONE 6X14 (Orthopedic Implant) ×2 IMPLANT
SPONGE INTESTINAL PEANUT (DISPOSABLE) ×2 IMPLANT
SPONGE SURGIFOAM ABS GEL SZ50 (HEMOSTASIS) IMPLANT
STAPLER VISISTAT 35W (STAPLE) IMPLANT
SUT MNCRL AB 3-0 PS2 18 (SUTURE) ×2 IMPLANT
SUT VIC AB 3-0 SH 8-18 (SUTURE) ×4 IMPLANT
TAPE CLOTH 3X10 TAN LF (GAUZE/BANDAGES/DRESSINGS) ×2 IMPLANT
TOWEL GREEN STERILE (TOWEL DISPOSABLE) ×2 IMPLANT
TOWEL GREEN STERILE FF (TOWEL DISPOSABLE) ×2 IMPLANT
WATER STERILE IRR 1000ML POUR (IV SOLUTION) ×2 IMPLANT

## 2019-06-04 NOTE — Anesthesia Procedure Notes (Addendum)
Procedure Name: Intubation Date/Time: 06/04/2019 12:48 PM Performed by: Renato Shin, CRNA Pre-anesthesia Checklist: Patient identified, Emergency Drugs available, Suction available and Patient being monitored Patient Re-evaluated:Patient Re-evaluated prior to induction Oxygen Delivery Method: Circle system utilized Preoxygenation: Pre-oxygenation with 100% oxygen Induction Type: IV induction and Rapid sequence Laryngoscope Size: Glidescope and 3 Grade View: Grade I Tube type: Oral Tube size: 7.0 mm Number of attempts: 1 Airway Equipment and Method: Stylet and Oral airway Placement Confirmation: ETT inserted through vocal cords under direct vision,  positive ETCO2 and breath sounds checked- equal and bilateral Secured at: 21 cm Tube secured with: Tape Dental Injury: Teeth and Oropharynx as per pre-operative assessment  Comments: cspine remained neutral throughout.

## 2019-06-04 NOTE — Progress Notes (Signed)
Dentures taken to PACU 
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
## 2019-06-04 NOTE — Brief Op Note (Signed)
06/03/2019 - 06/04/2019  2:56 PM  PATIENT:  Amy Mullins  58 y.o. female  PRE-OPERATIVE DIAGNOSIS:  Central cord syndrome, cervical spinal stenosis  POST-OPERATIVE DIAGNOSIS:  Central cord syndrome, cervical spinal stenosis  PROCEDURE:  Procedure(s) with comments: ANTERIOR CERVICAL DECOMPRESSION/DISCECTOMY FUSION 1 LEVEL (N/A) - anterior   SURGEON:  Surgeon(s) and Role:    * Ostergard, Joyice Faster, MD - Primary  PHYSICIAN ASSISTANT:   ASSISTANTS: none   ANESTHESIA:   general  EBL:  20 mL   BLOOD ADMINISTERED:none  DRAINS: none   LOCAL MEDICATIONS USED:  LIDOCAINE   SPECIMEN:  No Specimen  DISPOSITION OF SPECIMEN:  N/A  COUNTS:  YES  TOURNIQUET:  * No tourniquets in log *  DICTATION: .Note written in EPIC  PLAN OF CARE: Admit to inpatient   PATIENT DISPOSITION:  PACU - hemodynamically stable.   Delay start of Pharmacological VTE agent (>24hrs) due to surgical blood loss or risk of bleeding: yes

## 2019-06-04 NOTE — Anesthesia Postprocedure Evaluation (Signed)
Anesthesia Post Note  Patient: Amy Mullins  Procedure(s) Performed: ANTERIOR CERVICAL DECOMPRESSION/DISCECTOMY FUSION 1 LEVEL (N/A Spine Cervical)     Patient location during evaluation: PACU Anesthesia Type: General Level of consciousness: awake and alert Pain management: pain level controlled Vital Signs Assessment: post-procedure vital signs reviewed and stable Respiratory status: spontaneous breathing, nonlabored ventilation, respiratory function stable and patient connected to nasal cannula oxygen Cardiovascular status: blood pressure returned to baseline and stable Postop Assessment: no apparent nausea or vomiting Anesthetic complications: no    Last Vitals:  Vitals:   06/04/19 1612 06/04/19 1953  BP: (!) 132/53 (!) 103/53  Pulse: 72 83  Resp: 16   Temp: 36.8 C 37.2 C  SpO2: 100% 100%    Last Pain:  Vitals:   06/04/19 1953  TempSrc: Oral  PainSc:                  Ryan P Ellender

## 2019-06-04 NOTE — Transfer of Care (Signed)
Immediate Anesthesia Transfer of Care Note  Patient: Amy Mullins  Procedure(s) Performed: ANTERIOR CERVICAL DECOMPRESSION/DISCECTOMY FUSION 1 LEVEL (N/A Spine Cervical)  Patient Location: PACU  Anesthesia Type:General  Level of Consciousness: awake, alert , oriented and patient cooperative  Airway & Oxygen Therapy: Patient Spontanous Breathing and Patient connected to face mask oxygen  Post-op Assessment: Report given to RN and Post -op Vital signs reviewed and stable  Post vital signs: Reviewed and stable  Last Vitals:  Vitals Value Taken Time  BP 108/94 06/04/19 1510  Temp    Pulse 71 06/04/19 1521  Resp 17 06/04/19 1521  SpO2 98 % 06/04/19 1521  Vitals shown include unvalidated device data.  Last Pain:  Vitals:   06/04/19 1510  TempSrc:   PainSc: 0-No pain         Complications: No apparent anesthesia complications

## 2019-06-04 NOTE — Anesthesia Preprocedure Evaluation (Addendum)
Anesthesia Evaluation  Patient identified by MRN, date of birth, ID band Patient awake    Reviewed: Allergy & Precautions, NPO status , Patient's Chart, lab work & pertinent test results  Airway Mallampati: II  TM Distance: >3 FB Neck ROM: Full    Dental  (+) Edentulous Upper   Pulmonary neg pulmonary ROS,    Pulmonary exam normal breath sounds clear to auscultation       Cardiovascular negative cardio ROS Normal cardiovascular exam Rhythm:Regular Rate:Normal     Neuro/Psych  C-spine not cleared negative psych ROS   GI/Hepatic negative GI ROS, Neg liver ROS,   Endo/Other  Morbid obesity (Super)  Renal/GU negative Renal ROS     Musculoskeletal negative musculoskeletal ROS (+)   Abdominal (+) + obese,   Peds  Hematology negative hematology ROS (+)   Anesthesia Other Findings Spinal cord injury Cord compression  Central cord syndrome  Reproductive/Obstetrics                           Anesthesia Physical Anesthesia Plan  ASA: III  Anesthesia Plan: General   Post-op Pain Management:    Induction: Intravenous  PONV Risk Score and Plan: 4 or greater and Midazolam, Dexamethasone, Ondansetron and Treatment may vary due to age or medical condition  Airway Management Planned: Oral ETT and Video Laryngoscope Planned  Additional Equipment:   Intra-op Plan:   Post-operative Plan: Extubation in OR  Informed Consent: I have reviewed the patients History and Physical, chart, labs and discussed the procedure including the risks, benefits and alternatives for the proposed anesthesia with the patient or authorized representative who has indicated his/her understanding and acceptance.     Dental advisory given  Plan Discussed with: CRNA  Anesthesia Plan Comments:         Anesthesia Quick Evaluation
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
## 2019-06-05 NOTE — Progress Notes (Signed)
Rehab Admissions Coordinator Note:  Patient was screened by Michel Santee for appropriateness for an Inpatient Acute Rehab Consult.  At this time, we are recommending Inpatient Rehab consult. Please place IP Rehab MD consult order.   Michel Santee 06/05/2019, 9:00 PM  I can be reached at [phone].

## 2019-06-06 ENCOUNTER — Encounter (HOSPITAL_COMMUNITY): Payer: Self-pay | Admitting: Neurological Surgery

## 2019-06-06 NOTE — Progress Notes (Signed)
Inpatient Rehab Admissions:  Inpatient Rehab Consult received.  I met with pt at the bedside for rehabilitation assessment and to discuss goals and expectations of an inpatient rehab admission.  Pt interested in CIR program at this time. With permission, [facility] has contacted pt's daughter to discuss available caregiver support. Anticipate pt will still need some physical assist at DC after short CIR stay and unsure if this will be possible. AC has left voicemail with her daughter; Awaiting call back.  Jhonnie Garner, OTR/L  Rehab Admissions Coordinator  [phone] 06/06/2019 4:07 PM
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
## 2019-06-07 ENCOUNTER — Encounter (INDEPENDENT_AMBULATORY_CARE_PROVIDER_SITE_OTHER): Payer: Self-pay | Admitting: Ophthalmology

## 2019-06-07 ENCOUNTER — Inpatient Hospital Stay (HOSPITAL_COMMUNITY)
Admission: RE | Admit: 2019-06-07 | Discharge: 2019-06-15 | DRG: 092 | Disposition: A | Discharge status: Home Health | Payer: No Typology Code available for payment source | Source: Intra-hospital | Attending: Physical Medicine & Rehabilitation | Admitting: Physical Medicine & Rehabilitation

## 2019-06-07 ENCOUNTER — Other Ambulatory Visit: Payer: Self-pay

## 2019-06-07 ENCOUNTER — Encounter (HOSPITAL_COMMUNITY): Payer: Self-pay | Admitting: *Deleted

## 2019-06-07 ENCOUNTER — Encounter (HOSPITAL_COMMUNITY): Payer: Self-pay | Admitting: Physical Medicine and Rehabilitation

## 2019-06-07 DIAGNOSIS — Z6841 Body Mass Index (BMI) 40.0 and over, adult: Secondary | ICD-10-CM

## 2019-06-07 DIAGNOSIS — S060X9D Concussion with loss of consciousness of unspecified duration, subsequent encounter: Secondary | ICD-10-CM | POA: Diagnosis not present

## 2019-06-07 DIAGNOSIS — Z79891 Long term (current) use of opiate analgesic: Secondary | ICD-10-CM

## 2019-06-07 DIAGNOSIS — Y92410 Unspecified street and highway as the place of occurrence of the external cause: Secondary | ICD-10-CM

## 2019-06-07 DIAGNOSIS — S14129D Central cord syndrome at unspecified level of cervical spinal cord, subsequent encounter: Secondary | ICD-10-CM | POA: Diagnosis not present

## 2019-06-07 DIAGNOSIS — I82409 Acute embolism and thrombosis of unspecified deep veins of unspecified lower extremity: Secondary | ICD-10-CM | POA: Diagnosis not present

## 2019-06-07 DIAGNOSIS — G8929 Other chronic pain: Secondary | ICD-10-CM | POA: Diagnosis present

## 2019-06-07 DIAGNOSIS — Z8042 Family history of malignant neoplasm of prostate: Secondary | ICD-10-CM

## 2019-06-07 DIAGNOSIS — M5432 Sciatica, left side: Secondary | ICD-10-CM | POA: Diagnosis present

## 2019-06-07 DIAGNOSIS — Z981 Arthrodesis status: Secondary | ICD-10-CM | POA: Diagnosis not present

## 2019-06-07 DIAGNOSIS — S069X9A Unspecified intracranial injury with loss of consciousness of unspecified duration, initial encounter: Secondary | ICD-10-CM

## 2019-06-07 DIAGNOSIS — S14129S Central cord syndrome at unspecified level of cervical spinal cord, sequela: Secondary | ICD-10-CM

## 2019-06-07 DIAGNOSIS — N390 Urinary tract infection, site not specified: Secondary | ICD-10-CM | POA: Diagnosis present

## 2019-06-07 DIAGNOSIS — G9529 Other cord compression: Principal | ICD-10-CM | POA: Diagnosis present

## 2019-06-07 DIAGNOSIS — I82441 Acute embolism and thrombosis of right tibial vein: Secondary | ICD-10-CM | POA: Diagnosis not present

## 2019-06-07 DIAGNOSIS — F17210 Nicotine dependence, cigarettes, uncomplicated: Secondary | ICD-10-CM | POA: Diagnosis present

## 2019-06-07 DIAGNOSIS — D62 Acute posthemorrhagic anemia: Secondary | ICD-10-CM | POA: Diagnosis present

## 2019-06-07 DIAGNOSIS — S069XAA Unspecified intracranial injury with loss of consciousness status unknown, initial encounter: Secondary | ICD-10-CM

## 2019-06-07 DIAGNOSIS — H43811 Vitreous degeneration, right eye: Secondary | ICD-10-CM | POA: Diagnosis present

## 2019-06-07 DIAGNOSIS — Z7982 Long term (current) use of aspirin: Secondary | ICD-10-CM

## 2019-06-07 DIAGNOSIS — R5082 Postprocedural fever: Secondary | ICD-10-CM | POA: Diagnosis present

## 2019-06-07 DIAGNOSIS — H8110 Benign paroxysmal vertigo, unspecified ear: Secondary | ICD-10-CM | POA: Diagnosis present

## 2019-06-07 DIAGNOSIS — M7989 Other specified soft tissue disorders: Secondary | ICD-10-CM | POA: Diagnosis not present

## 2019-06-07 DIAGNOSIS — R509 Fever, unspecified: Secondary | ICD-10-CM | POA: Diagnosis not present

## 2019-06-07 DIAGNOSIS — S14109S Unspecified injury at unspecified level of cervical spinal cord, sequela: Secondary | ICD-10-CM

## 2019-06-07 DIAGNOSIS — R042 Hemoptysis: Secondary | ICD-10-CM

## 2019-06-07 DIAGNOSIS — B9620 Unspecified Escherichia coli [E. coli] as the cause of diseases classified elsewhere: Secondary | ICD-10-CM | POA: Diagnosis present

## 2019-06-07 DIAGNOSIS — S14129A Central cord syndrome at unspecified level of cervical spinal cord, initial encounter: Secondary | ICD-10-CM | POA: Diagnosis present

## 2019-06-07 DIAGNOSIS — Z79899 Other long term (current) drug therapy: Secondary | ICD-10-CM | POA: Diagnosis not present

## 2019-06-07 DIAGNOSIS — S069X0A Unspecified intracranial injury without loss of consciousness, initial encounter: Secondary | ICD-10-CM | POA: Diagnosis not present

## 2019-06-07 DIAGNOSIS — H8113 Benign paroxysmal vertigo, bilateral: Secondary | ICD-10-CM | POA: Diagnosis not present

## 2019-06-07 MED ORDER — OXYCODONE HCL 5 MG PO TABS
10.0000 mg | ORAL_TABLET | ORAL | Status: DC | PRN
  Administered 2019-06-07 – 2019-06-13 (×10): 10 mg via ORAL
  Filled 2019-06-07 (×12): qty 2

## 2019-06-07 MED ORDER — FLEET ENEMA 7-19 GM/118ML RE ENEM: 1.0000 | ENEMA | Freq: Once | RECTAL | Status: DC | PRN

## 2019-06-07 MED ORDER — DIPHENHYDRAMINE HCL 12.5 MG/5ML PO ELIX: 12.5000 mg | ORAL_SOLUTION | Freq: Four times a day (QID) | ORAL | Status: DC | PRN

## 2019-06-07 MED ORDER — CYCLOBENZAPRINE HCL 10 MG PO TABS
10.0000 mg | ORAL_TABLET | Freq: Three times a day (TID) | ORAL | Status: DC | PRN
  Administered 2019-06-08 – 2019-06-13 (×6): 10 mg via ORAL
  Filled 2019-06-07 (×6): qty 1

## 2019-06-07 MED ORDER — BISACODYL 10 MG RE SUPP: 10.0000 mg | Freq: Every day | RECTAL | Status: DC | PRN

## 2019-06-07 MED ORDER — ALUM & MAG HYDROXIDE-SIMETH 200-200-20 MG/5ML PO SUSP: 30.0000 mL | ORAL | Status: DC | PRN

## 2019-06-07 MED ORDER — MENTHOL 3 MG MT LOZG
1.0000 | LOZENGE | OROMUCOSAL | Status: DC | PRN
  Filled 2019-06-07: qty 9

## 2019-06-07 MED ORDER — PROCHLORPERAZINE 25 MG RE SUPP: 12.5000 mg | Freq: Four times a day (QID) | RECTAL | Status: DC | PRN

## 2019-06-07 MED ORDER — POLYETHYLENE GLYCOL 3350 17 G PO PACK
17.0000 g | PACK | Freq: Every day | ORAL | Status: DC
  Administered 2019-06-07 – 2019-06-13 (×7): 17 g via ORAL
  Filled 2019-06-07 (×8): qty 1

## 2019-06-07 MED ORDER — ONDANSETRON HCL 4 MG/2ML IJ SOLN: 4.0000 mg | Freq: Four times a day (QID) | INTRAMUSCULAR | Status: DC | PRN

## 2019-06-07 MED ORDER — GUAIFENESIN-DM 100-10 MG/5ML PO SYRP: 5.0000 mL | ORAL_SOLUTION | Freq: Four times a day (QID) | ORAL | Status: DC | PRN

## 2019-06-07 MED ORDER — PROCHLORPERAZINE EDISYLATE 10 MG/2ML IJ SOLN: 5.0000 mg | Freq: Four times a day (QID) | INTRAMUSCULAR | Status: DC | PRN

## 2019-06-07 MED ORDER — PROCHLORPERAZINE MALEATE 5 MG PO TABS: 5.0000 mg | ORAL_TABLET | Freq: Four times a day (QID) | ORAL | Status: DC | PRN

## 2019-06-07 MED ORDER — TRAZODONE HCL 50 MG PO TABS
25.0000 mg | ORAL_TABLET | Freq: Every evening | ORAL | Status: DC | PRN
  Administered 2019-06-07 – 2019-06-14 (×7): 50 mg via ORAL
  Filled 2019-06-07 (×9): qty 1

## 2019-06-07 MED ORDER — OXYCODONE HCL 5 MG PO TABS: 5.0000 mg | ORAL_TABLET | ORAL | 0 refills | Status: DC | PRN

## 2019-06-07 MED ORDER — CYCLOBENZAPRINE HCL 10 MG PO TABS: 10.0000 mg | ORAL_TABLET | Freq: Three times a day (TID) | ORAL | 0 refills | Status: DC | PRN

## 2019-06-07 MED ORDER — ACETAMINOPHEN 325 MG PO TABS
325.0000 mg | ORAL_TABLET | ORAL | Status: DC | PRN
  Administered 2019-06-09 – 2019-06-14 (×7): 650 mg via ORAL
  Filled 2019-06-07 (×9): qty 2

## 2019-06-07 MED ORDER — OXYCODONE HCL 5 MG PO TABS
5.0000 mg | ORAL_TABLET | ORAL | Status: DC | PRN
  Administered 2019-06-08 – 2019-06-15 (×3): 5 mg via ORAL
  Filled 2019-06-07 (×3): qty 1

## 2019-06-07 MED ORDER — POLYETHYLENE GLYCOL 3350 17 G PO PACK: 17.0000 g | PACK | Freq: Every day | ORAL | Status: DC | PRN

## 2019-06-07 MED ORDER — ONDANSETRON HCL 4 MG PO TABS: 4.0000 mg | ORAL_TABLET | Freq: Four times a day (QID) | ORAL | Status: DC | PRN

## 2019-06-07 MED ORDER — GABAPENTIN 400 MG PO CAPS
800.0000 mg | ORAL_CAPSULE | Freq: Every day | ORAL | Status: DC
  Administered 2019-06-08 – 2019-06-14 (×7): 800 mg via ORAL
  Filled 2019-06-07 (×7): qty 2

## 2019-06-07 MED ORDER — PHENOL 1.4 % MT LIQD: 1.0000 | OROMUCOSAL | Status: DC | PRN
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
## 2019-06-07 NOTE — Progress Notes (Signed)
Inpatient Rehabilitation-Admissions Coordinator   [facility]: [facility] Rhonda W has spoken with pt's daughter via phone who confirms support at DC. Pt still wanting to pursue CIR at this time. Awaiting medical clearance from MD.   Please call if questions.   Jhonnie Garner, OTR/L  Rehab Admissions Coordinator  [phone] 06/07/2019 12:45 PM

## 2019-06-07 NOTE — Progress Notes (Signed)
Patient admitted to IP-rehab. Patient A&Ox 4- patient orientated to unit and call light with-in reach  Amy Mullins

## 2019-06-07 NOTE — Progress Notes (Signed)
Inpatient Rehabilitation-Admissions Coordinator   [facility] has received medical approval from Dr. Zada Finders for admit to CIR today. Pt still wanting to pursue CIR at this time. AC has reviewed insurance information and consents signed. AC has updated RN, CM/SW regarding plan. Please call if questions.   Jhonnie Garner, OTR/L  Rehab Admissions Coordinator  [phone] 06/07/2019 2:11 PM
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
## 2019-06-07 NOTE — Discharge Instructions (Signed)
Discharge Instructions ° °No restriction in activities, slowly increase your activity back to normal.  ° °Your incision is closed with dermabond (purple glue). This will naturally fall off over the next 1-2 weeks.  ° °Okay to shower on the day of discharge. Use regular soap and water and try to be gentle when cleaning your incision.  ° °Follow up with Dr. Athalie Newhard in 2 weeks after discharge. If you do not already have a discharge appointment, please call his office at [phone] to schedule a follow up appointment. If you have any concerns or questions, please call the office and let us know. °
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
## 2019-06-08 ENCOUNTER — Inpatient Hospital Stay (HOSPITAL_COMMUNITY): Payer: Medicare Other | Admitting: Physical Therapy

## 2019-06-08 ENCOUNTER — Inpatient Hospital Stay (HOSPITAL_COMMUNITY): Payer: Medicare Other | Admitting: Occupational Therapy

## 2019-06-08 DIAGNOSIS — R509 Fever, unspecified: Secondary | ICD-10-CM

## 2019-06-08 DIAGNOSIS — D62 Acute posthemorrhagic anemia: Secondary | ICD-10-CM

## 2019-06-08 LAB — CBC WITH DIFFERENTIAL/PLATELET
Abs Immature Granulocytes: 0.03 10*3/uL (ref 0.00–0.07)
Basophils Absolute: 0 10*3/uL (ref 0.0–0.1)
Basophils Relative: 0 %
Eosinophils Absolute: 0.1 10*3/uL (ref 0.0–0.5)
Eosinophils Relative: 1 %
HCT: 32 % — ABNORMAL LOW (ref 36.0–46.0)
Hemoglobin: 9.8 g/dL — ABNORMAL LOW (ref 12.0–15.0)
Immature Granulocytes: 1 %
Lymphocytes Relative: 32 %
Lymphs Abs: 1.7 10*3/uL (ref 0.7–4.0)
MCH: 21.7 pg — ABNORMAL LOW (ref 26.0–34.0)
MCHC: 30.6 g/dL (ref 30.0–36.0)
MCV: 70.8 fL — ABNORMAL LOW (ref 80.0–100.0)
Monocytes Absolute: 0.4 10*3/uL (ref 0.1–1.0)
Monocytes Relative: 7 %
Neutro Abs: 3.2 10*3/uL (ref 1.7–7.7)
Neutrophils Relative %: 59 %
Platelets: 233 10*3/uL (ref 150–400)
RBC: 4.52 MIL/uL (ref 3.87–5.11)
RDW: 14.6 % (ref 11.5–15.5)
WBC: 5.4 10*3/uL (ref 4.0–10.5)
nRBC: 0 % (ref 0.0–0.2)

## 2019-06-08 LAB — URINALYSIS, COMPLETE (UACMP) WITH MICROSCOPIC
Bilirubin Urine: NEGATIVE
Glucose, UA: NEGATIVE mg/dL
Ketones, ur: NEGATIVE mg/dL
Nitrite: POSITIVE — AB
Protein, ur: NEGATIVE mg/dL
Specific Gravity, Urine: 1.01 (ref 1.005–1.030)
WBC, UA: >50 WBC/hpf — ABNORMAL HIGH (ref 0–5)
pH: 6 (ref 5.0–8.0)

## 2019-06-08 LAB — COMPREHENSIVE METABOLIC PANEL
ALT: 22 U/L (ref 0–44)
AST: 22 U/L (ref 15–41)
Albumin: 2.8 g/dL — ABNORMAL LOW (ref 3.5–5.0)
Alkaline Phosphatase: 40 U/L (ref 38–126)
Anion gap: 12 (ref 5–15)
BUN: 7 mg/dL (ref 6–20)
CO2: 26 mmol/L (ref 22–32)
Calcium: 8.7 mg/dL — ABNORMAL LOW (ref 8.9–10.3)
Chloride: 100 mmol/L (ref 98–111)
Creatinine, Ser: 0.72 mg/dL (ref 0.44–1.00)
GFR calc Af Amer: >60 mL/min (ref >60)
GFR calc non Af Amer: >60 mL/min (ref >60)
Glucose, Bld: 139 mg/dL — ABNORMAL HIGH (ref 70–99)
Potassium: 3.5 mmol/L (ref 3.5–5.1)
Sodium: 138 mmol/L (ref 135–145)
Total Bilirubin: 0.9 mg/dL (ref 0.3–1.2)
Total Protein: 6 g/dL — ABNORMAL LOW (ref 6.5–8.1)

## 2019-06-08 MED ORDER — CEPHALEXIN 250 MG PO CAPS
250.0000 mg | ORAL_CAPSULE | Freq: Three times a day (TID) | ORAL | Status: DC
  Administered 2019-06-08 – 2019-06-15 (×22): 250 mg via ORAL
  Filled 2019-06-08 (×23): qty 1

## 2019-06-08 MED ORDER — POLYSACCHARIDE IRON COMPLEX 150 MG PO CAPS
150.0000 mg | ORAL_CAPSULE | Freq: Every day | ORAL | Status: DC
  Administered 2019-06-08 – 2019-06-15 (×8): 150 mg via ORAL
  Filled 2019-06-08 (×8): qty 1

## 2019-06-08 MED ORDER — MECLIZINE HCL 25 MG PO TABS
25.0000 mg | ORAL_TABLET | Freq: Three times a day (TID) | ORAL | Status: DC | PRN
  Administered 2019-06-10 – 2019-06-11 (×2): 25 mg via ORAL
  Filled 2019-06-08 (×2): qty 1
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
## 2019-06-08 NOTE — Progress Notes (Signed)
Physical Therapy Session Note  Patient Details  Name: Amy Mullins MRN: [id number] Date of Birth: 11/06/1961  Today's Date: 06/08/2019 PT Individual Time: 1545-1630 PT Individual Time Calculation (min): 45 min   Short Term Goals: Week 1:  PT Short Term Goal 1 (Week 1): =LTG due to ELOS  Skilled Therapeutic Interventions/Progress Updates:   Pt received supine in bed and agreeable to PT. Supine>sit transfer with supervision assist. Pt reports 5/10 dizziness once at EOB. Sit<>stand with RW and supervision assist. With 7/10 dizziness. Pts mild decrease after 30 sec. CGA ambulatory transfer to toilet with RW. Pt able to void bladder, but not bowels. Peri car completed by Pt sitting on toilet. Ambulatory transfer to sink with RW and CGA. Pt performed hand hygiene with supervision assist. PT performed partial visual/vertigo assessment while pt sitting in WC.  VOR within available range, mild dizziness with increased speed. And decreased ability to track with L rotation. Vertical VOR with milld increase in dizziness. PT educated pt on peripheral/central vertigo. Functional gaze stabilization strategy instructed by PT for sit<>stand and sit>supine. Mild neck pain initially, and then instructed to maintain gaze on object at waist height to limited cervical extension. Performed sit<>stand x 3 with gaze stabilization and no reports of dizziness.  Pt returned to room and performed stand pivot transfer to bed with supervision. Sit>supine completed with supervision A and left supine in bed with call bell in reach and all needs met.      Therapy Documentation Precautions:  Precautions Precautions: Fall, Cervical Precaution Comments: cervical precautions  Restrictions Weight Bearing Restrictions: No General: PT Amount of Missed Time (min): 15 Minutes PT Missed Treatment Reason: Patient fatigue Vital Signs: Therapy Vitals Temp: 98.3 F (36.8 C) Pulse Rate: 89 BP: 134/75 Patient Position (if  appropriate): Lying Oxygen Therapy SpO2: 100 % O2 Device: Room Air Pain: Pain Assessment Pain Scale: 0-10 Pain Score: 8  Pain Type: Acute pain;Surgical pain Pain Location: Neck Pain Orientation: Posterior Pain Descriptors / Indicators: Aching;Constant Pain Frequency: Constant Pain Onset: On-going Patients Stated Pain Goal: 2 Pain Intervention(s): Medication (See eMAR) Mobility: Bed Mobility Bed Mobility: Rolling Right;Rolling Left;Supine to Sit;Sit to Supine Rolling Right: Contact Guard/Touching assist Rolling Left: Contact Guard/Touching assist Supine to Sit: Contact Guard/Touching assist Sit to Supine: Contact Guard/Touching assist Transfers Transfers: Sit to Stand;Stand to Sit;Stand Pivot Transfers Sit to Stand: Minimal Assistance - Patient > 75% Stand to Sit: Minimal Assistance - Patient > 75% Stand Pivot Transfers: Minimal Assistance - Patient > 75% Stand Pivot Transfer Details: Tactile cues for placement;Verbal cues for technique;Verbal cues for precautions/safety;Verbal cues for safe use of DME/AE Transfer (Assistive device): Rolling walker Locomotion : Gait Gait Distance (Feet): 100 Feet Assistive device: Rolling walker Gait Gait Pattern: Impaired(wide BOS) Gait velocity: decreased Stairs / Additional Locomotion Stairs: No Wheelchair Mobility Wheelchair Mobility: No  Trunk/Postural Assessment : Cervical Assessment Cervical Assessment: Exceptions to WFL(Forward head) Thoracic Assessment Thoracic Assessment: Exceptions to WFL(Kyphotic; rounded shoulders) Lumbar Assessment Lumbar Assessment: Exceptions to WFL(Posterior pelvic tilt) Postural Control Postural Control: Within Functional Limits  Balance: Balance Balance Assessed: Yes Static Sitting Balance Static Sitting - Balance Support: Feet supported;Bilateral upper extremity supported Static Sitting - Level of Assistance: 6: Modified independent (Device/Increase time);5: Stand by assistance Static Sitting  - Comment/# of Minutes: Sitting EOB Dynamic Sitting Balance Dynamic Sitting - Balance Support: Feet supported;No upper extremity supported;During functional activity Dynamic Sitting - Level of Assistance: 4: Min assist;3: Mod assist Sitting balance - Comments: Sitting to complete bathing task on tub transfer  bench Static Standing Balance Static Standing - Balance Support: During functional activity;Bilateral upper extremity supported Static Standing - Level of Assistance: 4: Min assist;3: Mod assist Static Standing - Comment/# of Minutes: Standing with RW Dynamic Standing Balance Dynamic Standing - Balance Support: During functional activity;Right upper extremity supported;Left upper extremity supported Dynamic Standing - Level of Assistance: 4: Min assist;3: Mod assist Dynamic Standing - Comments: Standing to complete toileting task Exercises:   Other Treatments:      Therapy/Group: Individual Therapy  Lorie Phenix 06/08/2019, 4:38 PM
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
## 2019-06-08 NOTE — Progress Notes (Signed)
Inpatient Rehabilitation  Patient information reviewed and entered into eRehab system by Phyllip Claw M. Fryda Molenda, M.A., CCC/SLP, PPS Coordinator.  Information including medical coding, functional ability and quality indicators will be reviewed and updated through discharge.    

## 2019-06-09 ENCOUNTER — Inpatient Hospital Stay (HOSPITAL_COMMUNITY): Payer: Medicare Other | Admitting: Physical Therapy

## 2019-06-09 ENCOUNTER — Inpatient Hospital Stay (HOSPITAL_COMMUNITY): Payer: Medicare Other | Admitting: Occupational Therapy

## 2019-06-09 NOTE — Progress Notes (Signed)
Occupational Therapy Session Note  Patient Details  Name: Amy Mullins MRN: [id number] Date of Birth: [birth year]  Today's Date: 06/09/2019 OT Individual Time: 1300-1415 OT Individual Time Calculation (min): 75 min    Short Term Goals: Week 1:  OT Short Term Goal 1 (Week 1): STG=LTG due to LOS  Skilled Therapeutic Interventions/Progress Updates:    Pt seen for OT ADL bathing/dressing session. Pt in supine upon arrival, voicing fatigue from previous tx sessions but willing to participate  As able. Voiced pain 7/10 in shoulders, RN made aware and medication administered at start of session.  Pt transferred to sitting EOB with supervision using hospital bed functions. She ambulated throughout room with close supervision using heavy duty RW. Completed toileting task with steadying assist and assist for controlled descent onto standard toilet.  She transferred into shower and bathed seated on tub transfer bench. Introduced to [facility] sponge and able to bathe with supervision using AE. She ambulated out of shower and dressed seated in standard chair. Hospital gown donned with set-up. TED hose applied total A. Education and demonstration provided regarding use of sock aid. Pt able to return demonstrate with min A to don B socks.  She then ambulated with RW into bathroom and gathered dirty towels from floor with use of reacher. Completed overall steadying assist with VCs for safety awareness and RW management in functional context and increased time for gaze stabilization with mobility.  She completed oral care standing at sink with close supervision and heavy UE reliance on sink ledge. Pt left sitting up in standard chair at end of session, all needs in reach and chair pad alarm on.    Therapy Documentation Precautions:  Precautions Precautions: Fall, Cervical Precaution Comments: cervical precautions  Restrictions Weight Bearing Restrictions: No   Therapy/Group: Individual Therapy  [name]  L 06/09/2019, 7:00 AM

## 2019-06-09 NOTE — Progress Notes (Signed)
Physical Therapy Session Note  Patient Details  Name: Amy Mullins MRN: [id number] Date of Birth: 10/14/1961  Today's Date: 06/09/2019 PT Individual Time: 1050-1130 PT Individual Time Calculation (min): 40 min   Short Term Goals: Week 1:  PT Short Term Goal 1 (Week 1): =LTG due to ELOS  Skilled Therapeutic Interventions/Progress Updates:   Pt received supine in bed and agreeable to PT. Supine>sit transfer with supervision assist and min cues for use of bed rails and gaze stabilization to limit dizziness with transfer. Sit<>stand with CGA from EOB and min cues for technique. Pt reports dizziness of 7/10 once in standing but subsides after 30sec.   Gait training with RW 2 x 192f and CGA from PT for safety, as well gaze stabilization with ambulation. Poor carryover from Pt when not being instructed in limiting cervical rotation. Dynamic gait training to perform figure 8 x 2 with mod cues for AD management and safety.   nustep reciprocal movement trianing x 8 minutes with BUE and BLE cues for full ROM and maintenance of neck precautions. Stand pivot and sit<>stand transfers with supervision assist throughout treatment, intermittent dizziness noted with transfers.   Pt returned to room and performed ambulatory transfer to bed with supervision A. Sit>supine completed with supervision assist, and heavy use of rails. Pt left supine in bed with call bell in reach and all needs met.           Therapy Documentation Precautions:  Precautions Precautions: Fall, Cervical Precaution Comments: cervical precautions  Restrictions Weight Bearing Restrictions: No    Pain: Pain Assessment Pain Scale: 0-10 Pain Score: 2  Pain Type: Acute pain Pain Location: Generalized Pain Orientation: (all over) Pain Descriptors / Indicators: (stiffness) Pain Intervention(s): Repositioned;Refused    Therapy/Group: Individual Therapy  ALorie Phenix7/01/2019, 11:32 AM
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
## 2019-06-09 NOTE — Progress Notes (Signed)
Physical Therapy Session Note  Patient Details  Name: Amy Mullins MRN: [id number] Date of Birth: November 30, 1961  Today's Date: 06/09/2019 PT Individual Time: 0800-0900 PT Individual Time Calculation (min): 60 min   Short Term Goals: Week 1:  PT Short Term Goal 1 (Week 1): =LTG due to ELOS  Skilled Therapeutic Interventions/Progress Updates:    Pt received seated in bed, agreeable to PT session. Pt reports soreness in her neck and R knee, not rated and declines intervention. Supine to sit with CGA with HOB elevated. Sit to stand with min A to RW. Ambulation to bathroom with RW and CGA. Toilet transfer with min A. Ambulation x 150 ft with RW and CGA. Ascend/descend 8 3" stairs with 2 handrails and CGA to min A. Pt reports she has a few stairs at home to get from the parking lot up to her apartment. Pt also reports falling on the stairs while holding her grandchildren last year so she is nervous when she has to do stairs now. Pt exhibits decreased safety awareness throughout session and attempts to stand from the w/c multiple times before it is locked. Pt left seated in chair in room with needs in reach, chair alarm in place at end of session.  Therapy Documentation Precautions:  Precautions Precautions: Fall, Cervical Precaution Comments: cervical precautions  Restrictions Weight Bearing Restrictions: No    Therapy/Group: Individual Therapy   Excell Seltzer, PT, DPT  06/09/2019, 12:51 PM
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
## 2019-06-10 ENCOUNTER — Inpatient Hospital Stay (HOSPITAL_COMMUNITY): Payer: Medicare Other

## 2019-06-10 ENCOUNTER — Inpatient Hospital Stay (HOSPITAL_COMMUNITY): Payer: Medicare Other | Admitting: Physical Therapy

## 2019-06-10 ENCOUNTER — Inpatient Hospital Stay (HOSPITAL_COMMUNITY): Payer: Medicare Other | Admitting: Occupational Therapy

## 2019-06-10 DIAGNOSIS — H8110 Benign paroxysmal vertigo, unspecified ear: Secondary | ICD-10-CM

## 2019-06-10 DIAGNOSIS — S14109S Unspecified injury at unspecified level of cervical spinal cord, sequela: Secondary | ICD-10-CM

## 2019-06-10 LAB — URINE CULTURE: Culture: >=100000 — AB

## 2019-06-10 NOTE — Progress Notes (Signed)
Occupational Therapy Session Note  Patient Details  Name: Amy Mullins MRN: [id number] Date of Birth: [birth year]  Today's Date: 06/10/2019 OT Individual Time: 1445-1530 OT Individual Time Calculation (min): 45 min    Short Term Goals: Week 1:  OT Short Term Goal 1 (Week 1): STG=LTG due to LOS  Skilled Therapeutic Interventions/Progress Updates:    1;1. Pt received in bed reporting fatigue but willing to participate in tx. Pt sits EOB with CGA from supine. Pt insrtucted in reacher use to thread pants over BLE 2x for AE training. Pt cued to thread LLE first d/t pain in sciatic nerve impacting movement. Pt requesting to make cup of coffee. Pt ambulates to familiy room and makes coffee in kurig with seated rest break d/t fatigue/pain in LLE. Pt ambulates back to room with S overall and OT transports coffee. Instructed on walker adapatations to be able to transport beverages/snacks. Exited session with pt seated in w/c, call light in reach and al need smet  Therapy Documentation Precautions:  Precautions Precautions: Fall, Cervical Precaution Comments: cervical precautions  Restrictions Weight Bearing Restrictions: No General:   Vital Signs: Therapy Vitals Temp: 98.3 F (36.8 C) Temp Source: Oral Pulse Rate: 84 Resp: 19 BP: 125/83 Patient Position (if appropriate): Sitting Oxygen Therapy SpO2: 99 % O2 Device: Room Air Pain: Pain Assessment Pain Scale: 0-10 Pain Score: 4  Pain Type: Acute pain Pain Location: Neck Pain Orientation: Medial Pain Radiating Towards: back Pain Frequency: Intermittent Pain Onset: On-going Pain Intervention(s): Medication (See eMAR) ADL:   Vision   Perception    Praxis   Exercises:   Other Treatments:     Therapy/Group: Individual Therapy  Tonny Branch 06/10/2019, 3:29 PM

## 2019-06-10 NOTE — Care Management (Signed)
Inpatient Rehabilitation Center Individual Statement of Services  Patient Name:  Amy Mullins  Date:  06/10/2019  Welcome to the [facility].  Our goal is to provide you with an individualized program based on your diagnosis and situation, designed to meet your specific needs.  With this comprehensive rehabilitation program, you will be expected to participate in at least 3 hours of rehabilitation therapies Monday-Friday, with modified therapy programming on the weekends.  Your rehabilitation program will include the following services:  Physical Therapy (PT), Occupational Therapy (OT), 24 hour per day rehabilitation nursing, Therapeutic Recreaction (TR), Neuropsychology, Case Management (Social Worker), Rehabilitation Medicine, Nutrition Services and Pharmacy Services  Weekly team conferences will be held on Tuesdays to discuss your progress.  Your Social Worker will talk with you frequently to get your input and to update you on team discussions.  Team conferences with you and your family in attendance may also be held.  Expected length of stay: 7 - 10 days   Overall anticipated outcome: modified independent  Depending on your progress and recovery, your program may change. Your Social Worker will coordinate services and will keep you informed of any changes. Your Social Worker's name and contact numbers are listed  below.  The following services may also be recommended but are not provided by the [facility] will be made to provide these services after discharge if needed.  Arrangements include referral to agencies that provide these services.  Your insurance has been verified to be:  Medicare and Medicaid Your primary doctor is:  ?[facility]  Pertinent information will be shared with your doctor and your insurance company.  Social Worker:   [place], [place] or [phone]   Information discussed with and copy given to patient by: Lennart Pall, 06/10/2019, 2:25 PM

## 2019-06-10 NOTE — Progress Notes (Signed)
Occupational Therapy Session Note  Patient Details  Name: Amy Mullins MRN: [id number] Date of Birth: Nov 03, 1961  Today's Date: 06/10/2019 OT Individual Time: 0940-1050 OT Individual Time Calculation (min): 70 min    Short Term Goals: Week 1:  OT Short Term Goal 1 (Week 1): STG=LTG due to LOS  Skilled Therapeutic Interventions/Progress Updates:    Pt seen for OT session focusing on ADL re-training and functional transfers. Pt asleep in supine upon arrival, easily awoken, however, complaints of fatigue from previous tx sessions and bad night of sleep. With encouragement, pt willing to participate as able. She declined practicing bed mobility from flat bed like she has at home, instead using hospital bed functions to come to sitting EOB with supervision.  Throughout session, she completed sit>stand and functional ambulation using heavy duty RW with supervision and increased time required for gaze stabilization 2/2 dizziness with changes of position. Occasional cuing requiring for safety awareness and to ensure stability prior to mobility.  She ambulated to standard chair at sink and opted to complete UB bathing only. Completed with set-up assist. Pt still with no personal clothes, reports she called daughter to bring personal items in. Hospital gown donned with set-up.  Pt transitioned to w/c and taken to ADL apartment total A in w/c for time and energy conservation. Following education and demonstration, pt completed simulated tub/shower transfer utilizing tub bench. Completed with supervision and VCs for technique.  She ambulated out of bathroom to couch and completed sit<>stand from couch with supervision, however, demonstrates poor descentric control needed for controled sit on couch and required increased time to power into standing from low surface.  She ambulated into kitchen and practiced obtaining and replacing items in overhead kitchen cabinet and refrigerator. VCs for RW management in  functional context as well as safety awareness as pt becoming slightly impulsive and unsafe when becoming more fatigued. She returned to w/c and education provided regarding energy conservation techniques in the kitchen, importance of taking seated rest breaks before they are needed in order to reduce fall risk. She self propelled w/c ~70ft back towards room at end of session, taken remainder of way total A in w/c.  In room, she ambulated into bathroom, difficulty locating where bathroom door was, and completed toileting task with supervision. She returned to room and completed hand hygiene standing at sink.  Pt returned to recliner at end of session, left seated with all needs in reach and chair pad alarm on.   Therapy Documentation Precautions:  Precautions Precautions: Fall, Cervical Precaution Comments: cervical precautions  Restrictions Weight Bearing Restrictions: (P) No   Therapy/Group: Individual Therapy  Phuoc Huy L 06/10/2019, 6:47 AM

## 2019-06-10 NOTE — IPOC Note (Signed)
Overall Plan of Care [facility](IPOC) Patient Details Name: Amy Mullins MRN: [id number] DOB: [birth year]  Admitting Diagnosis: <principal problem not specified>  Hospital Problems: Active Problems:   Central cord syndrome (HCC)     Functional Problem List: Nursing Endurance, Motor, Safety, Pain  PT Balance, Endurance, Safety  OT Balance, Safety, Behavior, Endurance, Pain  SLP    TR         Basic ADL's: OT Grooming, Bathing, Dressing, Toileting     Advanced  ADL's: OT Simple Meal Preparation     Transfers: PT Bed Mobility, Bed to Chair, Car, Furniture, Civil Service fast streamerloor  OT Toilet, Research scientist (life sciences)Tub/Shower     Locomotion: PT Ambulation, Psychologist, prison and probation servicesWheelchair Mobility, Stairs     Additional Impairments: OT None  SLP        TR      Anticipated Outcomes Item Anticipated Outcome  Self Feeding Indep  Swallowing      Basic self-care  Set-up/supervision  Toileting  Mod I   Bathroom Transfers Mod I toilet transfers; min A tub/shower transfers  Bowel/Bladder     Transfers  mod I  Locomotion  mod I with LRAD  Communication     Cognition     Pain  maintain pain <3  Safety/Judgment  patient will remain free of falls during admission   Therapy Plan: PT Intensity: Minimum of 1-2 x/day ,45 to 90 minutes PT Frequency: 5 out of 7 days PT Duration Estimated Length of Stay: 7-10 days OT Intensity: Minimum of 1-2 x/day, 45 to 90 minutes OT Frequency: 5 out of 7 days OT Duration/Estimated Length of Stay: 7-10 days     Due to the current state of emergency, patients may not be receiving their 3-hours of Medicare-mandated therapy.   Team Interventions: Nursing Interventions Patient/Family Education, Skin Care/Wound Management, Medication Management, Pain Management  PT interventions Ambulation/gait training, Warden/rangerBalance/vestibular training, Community reintegration, Discharge planning, Disease management/prevention, DME/adaptive equipment instruction, Functional mobility training, Pain management, Patient/family  education, Psychosocial support, Stair training, Therapeutic Activities, Therapeutic Exercise, UE/LE Strength taining/ROM, UE/LE Coordination activities, Visual/perceptual remediation/compensation  OT Interventions Balance/vestibular training, Discharge planning, Pain management, Self Care/advanced ADL retraining, Therapeutic Activities, UE/LE Coordination activities, Visual/perceptual remediation/compensation, Therapeutic Exercise, Skin care/wound managment, Patient/family education, Functional mobility training, Community reintegration, Fish farm managerDME/adaptive equipment instruction, Neuromuscular re-education, Psychosocial support, UE/LE Strength taining/ROM  SLP Interventions    TR Interventions    SW/CM Interventions Discharge Planning, Psychosocial Support, Patient/Family Education   Barriers to Discharge MD  Medical stability  Nursing Weight    PT Decreased caregiver support, Medical stability, Home environment access/layout    OT Other (comments) Vestibular deficits  SLP      SW       Team Discharge Planning: Destination: PT-Home ,OT- Home , SLP-  Projected Follow-up: PT-Home health PT, OT-  Home health OT, SLP-  Projected Equipment Needs: PT-To be determined, OT- Tub/shower bench, SLP-  Equipment Details: PT-TBD pending progress, OT-  Patient/family involved in discharge planning: PT- Patient,  OT-Patient, SLP-   MD ELOS: 7-10 days Medical Rehab Prognosis:  Excellent Assessment: The patient has been admitted for CIR therapies with the diagnosis of cervical myelopathy and concussion. The team will be addressing functional mobility, strength, stamina, balance, safety, adaptive techniques and equipment, self-care, bowel and bladder mgt, patient and caregiver education, NMR, vestibular rx, community reentry. Goals have been set at mod I to set up with self- care and mod I with mobility.   Due to the current state of emergency, patients may not be receiving their 3 hours per day  of  Medicare-mandated therapy.    Meredith Staggers, MD, FAAPMR      See Team Conference Notes for weekly updates to the plan of care
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
## 2019-06-10 NOTE — Progress Notes (Signed)
Physical Therapy Session Note  Patient Details  Name: Amy Mullins MRN: [id number] Date of Birth: [birth year]  Today's Date: 06/10/2019 PT Individual Time: 0800-0900 PT Individual Time Calculation (min): 60 min   Short Term Goals: Week 1:  PT Short Term Goal 1 (Week 1): =LTG due to ELOS  Skilled Therapeutic Interventions/Progress Updates:      Pt received seated in chair in room, agreeable to PT session. Pt reports feeling frustrated and with questions with regards to some tightness in her neck at her surgical site, swelling in her legs, dizziness, and ongoing numbness in her BUE. MD present during session and able to provide pt with answers to her questions as well as education from therapist with regards to nerve pain, edema management, and vestibular symptoms. Pt performs sit to stand with Supervision to heavy duty RW throughout session. Ambulation 2 x 150 ft with RW and close Supervision for balance. Pt reports some hyperextension of R knee with gait causing discomfort. Ascend/descend 4 6" stairs with 2 handrails with CGA going forwards. Ascend/descend 2 6" stairs laterally with R handrail and min A to simulate home environment. Pt exhibits some anxiety and fear of falling with stairs due to previous fall at home on the stairs. Trial gait with rail in hallway for one UE support and min A, pt has onset of antalgic gait due to R knee pain and decreased RLE stability. Pt is safest to continue gait with RW at this time. Standing alt L/R target taps with RW and min A for balance for BLE stability and coordination training. Pt requires increased assist when standing on RLE. Pt left seated in bed with needs in reach at end of session, bed alarm in place.  Therapy Documentation Precautions:  Precautions Precautions: Fall, Cervical Precaution Comments: cervical precautions  Restrictions Weight Bearing Restrictions: (P) No    Therapy/Group: Individual Therapy   Excell Seltzer, PT,  DPT  06/10/2019, 12:19 PM
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
## 2019-06-11 ENCOUNTER — Inpatient Hospital Stay (HOSPITAL_COMMUNITY): Payer: Medicare Other

## 2019-06-11 ENCOUNTER — Inpatient Hospital Stay (HOSPITAL_COMMUNITY): Payer: No Typology Code available for payment source

## 2019-06-11 ENCOUNTER — Encounter (HOSPITAL_COMMUNITY): Payer: Medicare Other

## 2019-06-11 DIAGNOSIS — H8110 Benign paroxysmal vertigo, unspecified ear: Secondary | ICD-10-CM

## 2019-06-11 DIAGNOSIS — S069X0A Unspecified intracranial injury without loss of consciousness, initial encounter: Secondary | ICD-10-CM

## 2019-06-11 DIAGNOSIS — D62 Acute posthemorrhagic anemia: Secondary | ICD-10-CM

## 2019-06-11 DIAGNOSIS — M7989 Other specified soft tissue disorders: Secondary | ICD-10-CM

## 2019-06-11 DIAGNOSIS — N390 Urinary tract infection, site not specified: Secondary | ICD-10-CM

## 2019-06-11 DIAGNOSIS — S069X9A Unspecified intracranial injury with loss of consciousness of unspecified duration, initial encounter: Secondary | ICD-10-CM

## 2019-06-11 DIAGNOSIS — S14109S Unspecified injury at unspecified level of cervical spinal cord, sequela: Secondary | ICD-10-CM

## 2019-06-11 DIAGNOSIS — I82409 Acute embolism and thrombosis of unspecified deep veins of unspecified lower extremity: Secondary | ICD-10-CM

## 2019-06-11 DIAGNOSIS — S069XAA Unspecified intracranial injury with loss of consciousness status unknown, initial encounter: Secondary | ICD-10-CM

## 2019-06-11 HISTORY — DX: Acute embolism and thrombosis of unspecified deep veins of unspecified lower extremity: I82.409

## 2019-06-11 MED ORDER — ENOXAPARIN SODIUM 80 MG/0.8ML ~~LOC~~ SOLN
0.5000 mg/kg | SUBCUTANEOUS | Status: DC
  Administered 2019-06-11 – 2019-06-14 (×4): 80 mg via SUBCUTANEOUS
  Filled 2019-06-11 (×5): qty 0.8
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
## 2019-06-11 NOTE — Progress Notes (Signed)
Bilateral lower extremity venous duplex completed. Refer to "CV Proc" under chart review to view preliminary results.  Critical results discussed with Santiago Glad, RN, who confirmed she will communicate results to MD.  06/11/2019 4:00 PM Maudry Mayhew, MHA, RVT, RDCS, RDMS

## 2019-06-11 NOTE — Progress Notes (Signed)
Occupational Therapy Session Note  Patient Details  Name: Amy Mullins MRN: [id number] Date of Birth: 04-05-1961  Today's Date: 06/11/2019 OT Individual Time: 1100-1155 OT Individual Time Calculation (min): 55 min    Short Term Goals: Week 1:  OT Short Term Goal 1 (Week 1): STG=LTG due to LOS  Skilled Therapeutic Interventions/Progress Updates:    1:1. Pt received in bed reporting fatigue but no pain. Pt ambulates with MIN A to sit to stand from low reclinet then supervision to transfer onto [facility] and TTB in bathroom with RW. Pt toilets with S and bathes/dresses from sit to stand level with S using LHSS to wash BLE. Pt dresses donning 2 gowns with set up and socks with supervision using wide sock aide. Pt completes grooming in standing at sink with S. Pt ambulates with RW to/from family room to make cup of coffee per pt requires with supervision and seated rest break once in family room. Exited session with pt seated EOB, call light in reach and all needs met  Therapy Documentation Precautions:  Precautions Precautions: Fall, Cervical Precaution Comments: cervical precautions  Restrictions Weight Bearing Restrictions: No General:   Vi  Therapy/Group: Individual Therapy  Tonny Branch 06/11/2019, 11:21 AM

## 2019-06-11 NOTE — Progress Notes (Signed)
Rec'd call from vascular, pt has DVT. Called on call, new orders for  lovenox and d/c SCDs

## 2019-06-11 NOTE — Progress Notes (Signed)
Occupational Therapy Session Note  Patient Details  Name: Amy Mullins MRN: [id number] Date of Birth: [birth year]  Today's Date: 06/11/2019 OT Individual Time: 8719-5974 OT Individual Time Calculation (min): 55 min    Short Term Goals: Week 1:  OT Short Term Goal 1 (Week 1): STG=LTG due to LOS  Skilled Therapeutic Interventions/Progress Updates:    1;1. Pt received in bed reporting need to toilet. Pt completes ambulation to bathroom with supervision with RW and toilets without VC. Pt completes ambulation with 2 standing rest breaks with VC for resting prior to sciatic pain becoming unbearable as pt was beginning to drag LLE. Pt reports it is her "stubbornness" getting to her. RN delivers pain medication. Pt completes wii bowling activity for 2 games in seated and in standing with supervision with VC for hand placement during sit to stands. Exited session with pt seated in bed, call light in reach and all needs met.   Therapy Documentation Precautions:  Precautions Precautions: Fall, Cervical Precaution Comments: cervical precautions  Restrictions Weight Bearing Restrictions: No General:   Vital Signs: Therapy Vitals Temp: 98.3 F (36.8 C) Temp Source: Oral Pulse Rate: 93 Resp: 18 BP: 116/75 Patient Position (if appropriate): Sitting Oxygen Therapy SpO2: 97 % O2 Device: Room Air Pain: Pain Assessment Pain Scale: 0-10 Pain Score: 8  Faces Pain Scale: Hurts little more Pain Type: Chronic pain Pain Location: Back Pain Orientation: Mid;Lower Pain Radiating Towards: hip Pain Descriptors / Indicators: Aching;Throbbing Pain Frequency: Constant Pain Onset: On-going Pain Intervention(s): Medication (See eMAR) ADL:   Vision   Perception    Praxis   Exercises:   Other Treatments:     Therapy/Group: Individual Therapy  Tonny Branch 06/11/2019, 3:18 PM
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
## 2019-06-12 ENCOUNTER — Inpatient Hospital Stay (HOSPITAL_COMMUNITY): Payer: Medicare Other | Admitting: Occupational Therapy

## 2019-06-12 DIAGNOSIS — S069X0S Unspecified intracranial injury without loss of consciousness, sequela: Secondary | ICD-10-CM

## 2019-06-12 DIAGNOSIS — I82441 Acute embolism and thrombosis of right tibial vein: Secondary | ICD-10-CM
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
## 2019-06-13 ENCOUNTER — Inpatient Hospital Stay (HOSPITAL_COMMUNITY): Payer: No Typology Code available for payment source

## 2019-06-13 ENCOUNTER — Encounter (INDEPENDENT_AMBULATORY_CARE_PROVIDER_SITE_OTHER): Payer: Medicare Other | Admitting: Ophthalmology

## 2019-06-13 ENCOUNTER — Encounter (HOSPITAL_COMMUNITY): Payer: Medicare Other | Admitting: Psychology

## 2019-06-13 ENCOUNTER — Inpatient Hospital Stay (HOSPITAL_COMMUNITY): Payer: Medicare Other | Admitting: Physical Therapy

## 2019-06-13 ENCOUNTER — Inpatient Hospital Stay (HOSPITAL_COMMUNITY): Payer: Medicare Other | Admitting: Occupational Therapy

## 2019-06-13 DIAGNOSIS — S14129D Central cord syndrome at unspecified level of cervical spinal cord, subsequent encounter: Secondary | ICD-10-CM

## 2019-06-13 LAB — CBC
HCT: 33.2 % — ABNORMAL LOW (ref 36.0–46.0)
Hemoglobin: 10 g/dL — ABNORMAL LOW (ref 12.0–15.0)
MCH: 21.6 pg — ABNORMAL LOW (ref 26.0–34.0)
MCHC: 30.1 g/dL (ref 30.0–36.0)
MCV: 71.9 fL — ABNORMAL LOW (ref 80.0–100.0)
Platelets: 297 10*3/uL (ref 150–400)
RBC: 4.62 MIL/uL (ref 3.87–5.11)
RDW: 14.9 % (ref 11.5–15.5)
WBC: 7.1 10*3/uL (ref 4.0–10.5)
nRBC: 0 % (ref 0.0–0.2)

## 2019-06-13 LAB — BASIC METABOLIC PANEL
Anion gap: 8 (ref 5–15)
BUN: 6 mg/dL (ref 6–20)
CO2: 27 mmol/L (ref 22–32)
Calcium: 9.1 mg/dL (ref 8.9–10.3)
Chloride: 102 mmol/L (ref 98–111)
Creatinine, Ser: 0.68 mg/dL (ref 0.44–1.00)
GFR calc Af Amer: >60 mL/min (ref >60)
GFR calc non Af Amer: >60 mL/min (ref >60)
Glucose, Bld: 143 mg/dL — ABNORMAL HIGH (ref 70–99)
Potassium: 4.2 mmol/L (ref 3.5–5.1)
Sodium: 137 mmol/L (ref 135–145)

## 2019-06-13 NOTE — Progress Notes (Signed)
Occupational Therapy Session Note  Patient Details  Name: Amy Mullins MRN: [id number] Date of Birth: Apr 28, 1961  Today's Date: 06/13/2019 OT Individual Time: 6314-9702 OT Individual Time Calculation (min): 55 min    Short Term Goals: Week 1:  OT Short Term Goal 1 (Week 1): STG=LTG due to LOS  Skilled Therapeutic Interventions/Progress Updates:    Pt seen for OT ADL bathing/dressing session. Pt in supine upon arrival, agreeable to tx session and denying pain. Pt agreeable to bathing at shower level this session.  She transferred to sitting EOB with supervision from flat bed without bed rails in simulation of home environment. Functional ambulation throughout session with RW and supervision. Pt at times impulsive with poor safety awareness with RW in functional context requiring VCs and education throughout.  She completed toileting task mod I and transitioned into shower. She bathed seated on tub transfer with set-up and distant supervision using LH sponge to wash LEs. She exited shower with increased time and VCs as pt with dizziness upon changes in position, education not to cont with mobility until dizziness had subsided and using stationary point to assist with gaze stabilization.  She dressed seated in standard chair, threading pants on independently and standing with RW to pull pants up. Min A required with use of sock aid to don non-slid socks.  She completed oral care standing at sink with supervision and UE support on sink ledge. She gathered dirty towels from bathroom floor with use of reacher with supervision and VCs. Educated regarding use of AE in order to limit up/down motions which illicits dizziness.  Following seated rest break she ambulated to therapy gym using RW and supervision. Completed 5 minutes on NuStep per pt request with intermittent rest breaks required.  Throughout session, education/discussion regarding DME, CLOF, OT/PT goals, safety considerations, and d/c planning.    Therapy Documentation Precautions:  Precautions Precautions: Fall, Cervical Precaution Comments: cervical precautions  Restrictions Weight Bearing Restrictions: No Pain: Pain Assessment Pain Scale: 0-10 Pain Score: 0-No pain   Therapy/Group: Individual Therapy  Amy Mullins L 06/13/2019, 7:08 AM
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
## 2019-06-13 NOTE — Progress Notes (Signed)
Physical Therapy Session Note  Patient Details  Name: Amy Mullins MRN: [id number] Date of Birth: Jan 10, 1961  Today's Date: 06/13/2019 PT Individual Time: 1130-1145 PT Individual Time Calculation (min): 15 min   Short Term Goals: Week 1:  PT Short Term Goal 1 (Week 1): =LTG due to ELOS  Skilled Therapeutic Interventions/Progress Updates:    pt rec'd in bed for follow up to vestibular testing.  Pt states that focusing on an object during transitional movements has improved dizziness. Pt also states she is not wearing her glasses which may contribute to symptoms.  Pt given handout of sit <> stand and 90 degree turns while focusing on an object.  Pt missed 15 minutes due to being off unit for procedure.  Therapy Documentation Precautions:  Precautions Precautions: Fall, Cervical Precaution Comments: cervical precautions  Restrictions Weight Bearing Restrictions: No General: PT Amount of Missed Time (min): 15 Minutes PT Missed Treatment Reason: Xray Pain:  no c/o pain    Therapy/Group: Individual Therapy  Daveion Robar 06/13/2019, 12:53 PM

## 2019-06-13 NOTE — Progress Notes (Signed)
Physical Therapy Session Note  Patient Details  Name: Amy Mullins MRN: [id number] Date of Birth: [birth year]  Today's Date: 06/13/2019 PT Individual Time: 1045-1130; 1445-1530 PT Individual Time Calculation (min): 45 min and 45 min PT Missed Time: 15 min Missed Time Reason: pt on personal phone call  Short Term Goals: Week 1:  PT Short Term Goal 1 (Week 1): =LTG due to ELOS  Skilled Therapeutic Interventions/Progress Updates:    Session 1: Pt received seated on Nustep handed off from OT session. Pt agreeable to PT with no complaints of pain. Pt transfers with Supervision to RW throughout session. Ambulation 3 x 150 ft with heavy duty RW and Supervision. Ambulation across carpeted surface with RW and Supervision to simulate home environment. Sit to stand from recliner chair with RW and Supervision. Ascend/descend one 6" curb step with RW and CGA to simulate home environment. Demonstration and v/c for safe RW management. Ascend/descend 4 6" stairs laterally with RW and CGA. Standing mini-squats 2 x 10 reps for BLE strengthening and control. Pt left seated EOB with needs in reach, bed alarm in place at end of session.  Session 2: Pt received seated in bed, pt on personal phone call and requests therapist return in a few min. Pt missed 15 min at beginning of therapy session for personal phone call. Once therapist returns pt agreeable to therapy session. No complaints of pain. Bed mobility Supervision. Sit to stand with Supervision to RW. Ambulation x 150 ft with RW and Supervision. Berg Balance Test: 31/56, high fall risk. Education with patient about fall risk and RW usage for improved safety with mobility and transfers. Ambulation back to room with RW and Supervision. Pt attempts to leave her RW outside the bathroom to go use the bathroom, re-educated pt on purpose of RW and safety education that was just reviewed with her prior to returning to her room. Pt continues to show poor insight and safety  awareness. Engineer, materials. Assisted pt back to bed at end of session, Supervision. Pt left semi-reclined in bed with needs in reach, bed alarm in place.   Therapy Documentation Precautions:  Precautions Precautions: Fall, Cervical Precaution Comments: cervical precautions  Restrictions Weight Bearing Restrictions: No   Balance: Balance Assessed: Yes Standardized Balance Assessment Standardized Balance Assessment: Berg Balance Test Berg Balance Test Sit to Stand: Able to stand without using hands and stabilize independently Standing Unsupported: Able to stand safely 2 minutes Sitting with Back Unsupported but Feet Supported on Floor or Stool: Able to sit safely and securely 2 minutes Stand to Sit: Sits safely with minimal use of hands Transfers: Able to transfer safely, minor use of hands Standing Unsupported with Eyes Closed: Able to stand 10 seconds with supervision Standing Ubsupported with Feet Together: Needs help to attain position but able to stand for 30 seconds with feet together From Standing, Reach Forward with Outstretched Arm: Reaches forward but needs supervision From Standing Position, Pick up Object from Floor: Unable to try/needs assist to keep balance From Standing Position, Turn to Look Behind Over each Shoulder: Turn sideways only but maintains balance Turn 360 Degrees: Needs assistance while turning Standing Unsupported, Alternately Place Feet on Step/Stool: Able to complete >2 steps/needs minimal assist Standing Unsupported, One Foot in Front: Able to take small step independently and hold 30 seconds Standing on One Leg: Tries to lift leg/unable to hold 3 seconds but remains standing independently Total Score: 31    Therapy/Group: Individual Therapy   Excell Seltzer, PT, DPT  06/13/2019,  11:50 AM

## 2019-06-14 ENCOUNTER — Inpatient Hospital Stay (HOSPITAL_COMMUNITY): Payer: Medicare Other | Admitting: Physical Therapy

## 2019-06-14 ENCOUNTER — Encounter (HOSPITAL_COMMUNITY): Payer: Medicare Other | Admitting: Occupational Therapy

## 2019-06-14 ENCOUNTER — Ambulatory Visit (HOSPITAL_COMMUNITY): Payer: Medicare Other | Admitting: Physical Therapy

## 2019-06-14 ENCOUNTER — Inpatient Hospital Stay (HOSPITAL_COMMUNITY): Payer: Medicare Other | Admitting: Occupational Therapy
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
## 2019-06-14 NOTE — Progress Notes (Signed)
Occupational Therapy Session Note  Patient Details  Name: Amy Mullins MRN: [id number] Date of Birth: May 08, 1961  Today's Date: 06/14/2019 OT Individual Time: 1105-1200 and 1330-1400 OT Individual Time Calculation (min): 55 min and 30 min   Short Term Goals: Week 1:  OT Short Term Goal 1 (Week 1): STG=LTG due to LOS  Skilled Therapeutic Interventions/Progress Updates:    Session One: Pt seen for OT ADL bathing/dressing session. Pt in supine upon arrival, agreeable to tx session. Complaints of headache, however, not asking for intervention. She ambulated throughout session with RW distant supervision-mod I level with occasional VCs for safety awareness/RW management in functional context. She completed toileting task and transfers mod I. She then transitioned to shower and bathed seated on TTB with set-up using LH sponge to assist with LEs. She exited bathroom and dressed seated EOB, mod I for donning hospital gown, TED hose and non-slid socks.  She ambulated to sink and complete oral care from standing position mod I with heavy reliance on UE on sink ledge.  Pt refusing out of room tx this session. Education/discussion regarding return to activity, safety awareness during ADLs/IADLs, energy conservation and plan for family education session this PM. Pt voices feeling ready for home.   Session Two: Pt seen for OT caregiver training session with pt's daughter. Pt in supine upon arrival, agreeable to tx session. RN administering pain for headache upon arrival. Pt's daughter arrived shortly after for scheduled family ed session. Pt ambulated throughout unit mod I using RW. Education/demonstration provided to pt's daughter regarding use of tub transfer bench. Pt able to complete simulated tub/shower transfer utilizing tub transfer bench with supervision. Completed sit<>stand from low soft surface couch mod I with decreased decentric control noted.  Extensive education provided to pt and daughter  regarding recommendations for d/c, pt's CLOF, use of DME and AD, continuum of care, need for use of RW and how to use in appropriate/ functional manner, recommendation for supervision during IADLs, energy conservation and d/c planning. Pt's daughter voiced understanding and agreement with all recommendations. Pt left seated on couch in ADL apartment at end of session with hand off to PT.   Therapy Documentation Precautions:  Precautions Precautions: Fall, Cervical Precaution Comments: cervical precautions  Restrictions Weight Bearing Restrictions: No   Therapy/Group: Individual Therapy  [name] L 06/14/2019, 7:08 AM
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
## 2019-06-14 NOTE — Progress Notes (Signed)
Physical Therapy Discharge Summary  Patient Details  Name: Amy Mullins MRN: [id number] Date of Birth: [birth year]  Today's Date: 06/14/2019 PT Individual Time:0900-1000; [id number] PT Minutes: 60 min and 30 min    Patient has met 5 of 6 long term goals due to improved activity tolerance, improved balance, improved postural control, increased strength and ability to compensate for deficits.  Patient to discharge at an ambulatory level Modified Independent.   Patient's care partner is independent to provide the necessary physical assistance at discharge. Pt is at mod I level overall with RW but does require setup and Supervision assist while completing stairs for RW management. Pt's daughter has completed hands on family training and is able to provide this level of assist for pt upon d/c home.  Reasons goals not met: Pt has met all goals adequately for a safe d/c home. Pt did not meet mod I level for stairs but is at Supervision level and her daughter is able to provide this level of assist for her.  Recommendation:  Patient will benefit from ongoing skilled PT services in home health setting to continue to advance safe functional mobility, address ongoing impairments in balance, endurance, strength, safety, vestibular symptoms, and minimize fall risk.  Equipment: Heavy duty RW  Reasons for discharge: treatment goals met and discharge from hospital  Patient/family agrees with progress made and goals achieved: Yes   Skilled Intervention: Session 1: Pt received supine in bed, agreeable to PT session. Pt reports having a headache, just received pain medication from RN. Pt also reports some dizziness this AM that improves with sitting focusing on a visual target. Bed mobility mod I. Pt transfers with mod I and RW. Ambulation x 150 ft with heavy duty RW and mod I. Provided pt with HEP handout for RLE quad strengthening due to ongoing knee hyperextension with gait: quad sets, LAQ, TKE, mini-squats x  10 reps. Pt demos good understanding and performance of HEP. Nustep level 2 x 5 min with use of B UE/LE for global endurance training. Pt left seated in bed with needs in reach at end of session.  Session 2: Pt received seated in apartment area with daughter present for hands-on family training session. Pt is able to stand from low pliable surface of couch with RW at mod I level. Ambulation to therapy gym with RW at mod I level. Ascend/descend 4 stairs laterally with L handrail and Supervision, demonstration of where to stand to guard patient and how to manage RW to bring up behind the patient. Pt's daughter demos good understanding of how to assist pt on stairs. Ascend/descend curb with RW at mod I level. Car transfer at mod I level with good demonstration of safe transfer technique. Pt's daughter has completed family training and pt is safe to d/c home tomorrow.  PT Discharge Precautions/Restrictions Precautions Precautions: Fall;Cervical Precaution Comments: cervical precautions  Restrictions Weight Bearing Restrictions: No Vision/Perception  Vision - History Baseline Vision: Wears glasses all the time Vision - Assessment Eye Alignment: Within Functional Limits Tracking/Visual Pursuits: Decreased smoothness of horizontal tracking Convergence: Within functional limits Perception Perception: Within Functional Limits Praxis Praxis: Intact  Cognition Overall Cognitive Status: No family/caregiver present to determine baseline cognitive functioning Arousal/Alertness: Awake/alert Orientation Level: Oriented X4 Attention: Focused Focused Attention: Appears intact Memory: Appears intact Awareness: Impaired Awareness Impairment: Anticipatory impairment Problem Solving: Appears intact Safety/Judgment: Impaired Comments: Sligtly impulsive with decreased awareness to deficits and functional implications Sensation Sensation Light Touch: Appears Intact(numbness in feet at baseline,  L>R) Peripheral  sensation comments: Reports tingling/ numbness in B fingertips Proprioception: Appears Intact Coordination Gross Motor Movements are Fluid and Coordinated: Yes Fine Motor Movements are Fluid and Coordinated: Yes Coordination and Movement Description: Impaired due to generalized weakness and vestibular impairments Motor  Motor Motor: Within Functional Limits Motor - Discharge Observations: generalized weakness; improved since eval  Mobility Bed Mobility Bed Mobility: Rolling Right;Rolling Left;Supine to Sit;Sit to Supine Rolling Right: Independent Rolling Left: Independent Supine to Sit: Independent Sit to Supine: Independent Transfers Transfers: Sit to Stand;Stand to Sit;Stand Pivot Transfers Sit to Stand: Independent with assistive device Stand to Sit: Independent with assistive device Stand Pivot Transfers: Independent with assistive device Transfer (Assistive device): Rolling walker Locomotion  Gait Ambulation: Yes Gait Assistance: Independent with assistive device Gait Distance (Feet): 150 Feet Assistive device: Rolling walker Gait Gait: Yes Gait Pattern: Impaired Gait Pattern: Wide base of support(R knee hyperextension occasionally) Gait velocity: decreased Stairs / Additional Locomotion Stairs: Yes Stairs Assistance: Supervision/Verbal cueing Stair Management Technique: One rail Left;Step to pattern Number of Stairs: 4 Height of Stairs: 6 Wheelchair Mobility Wheelchair Mobility: No  Trunk/Postural Assessment  Cervical Assessment Cervical Assessment: Exceptions to WFL(cervical precautions) Thoracic Assessment Thoracic Assessment: Exceptions to WFL(kyphotic; rounded shoulders) Lumbar Assessment Lumbar Assessment: Exceptions to WFL(posterior pelvic tilt) Postural Control Postural Control: Within Functional Limits  Balance Balance Balance Assessed: Yes Static Sitting Balance Static Sitting - Balance Support: No upper extremity supported;Feet  supported Static Sitting - Level of Assistance: 6: Modified independent (Device/Increase time) Dynamic Sitting Balance Dynamic Sitting - Balance Support: No upper extremity supported;Feet supported;During functional activity Dynamic Sitting - Level of Assistance: 6: Modified independent (Device/Increase time) Sitting balance - Comments: Sitting to complete bathing task on tub transfer bench Static Standing Balance Static Standing - Balance Support: Bilateral upper extremity supported;During functional activity Static Standing - Level of Assistance: 6: Modified independent (Device/Increase time) Static Standing - Comment/# of Minutes: Standing with RW Dynamic Standing Balance Dynamic Standing - Balance Support: During functional activity;Bilateral upper extremity supported Dynamic Standing - Level of Assistance: 6: Modified independent (Device/Increase time) Dynamic Standing - Comments: Standing to complete LB clothing management/toileting task Extremity Assessment  RUE Assessment RUE Assessment: Within Functional Limits LUE Assessment LUE Assessment: Within Functional Limits RLE Assessment RLE Assessment: Within Functional Limits General Strength Comments: 4+/5 grossly LLE Assessment LLE Assessment: Within Functional Limits General Strength Comments: 4+/5 grossly     Excell Seltzer, PT, DPT 06/14/2019, 3:00 PM
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
## 2019-06-15 ENCOUNTER — Inpatient Hospital Stay (HOSPITAL_COMMUNITY): Payer: No Typology Code available for payment source

## 2019-06-15 DIAGNOSIS — I82409 Acute embolism and thrombosis of unspecified deep veins of unspecified lower extremity: Secondary | ICD-10-CM

## 2019-06-15 MED ORDER — POLYETHYLENE GLYCOL 3350 17 G PO PACK: 17.0000 g | PACK | Freq: Every day | ORAL | 0 refills | Status: DC

## 2019-06-15 MED ORDER — MECLIZINE HCL 25 MG PO TABS: 25.0000 mg | ORAL_TABLET | Freq: Three times a day (TID) | ORAL | 0 refills | Status: DC | PRN

## 2019-06-15 MED ORDER — CYCLOBENZAPRINE HCL 10 MG PO TABS: 10.0000 mg | ORAL_TABLET | Freq: Three times a day (TID) | ORAL | 0 refills | Status: DC | PRN

## 2019-06-15 MED ORDER — POLYSACCHARIDE IRON COMPLEX 150 MG PO CAPS: 150.0000 mg | ORAL_CAPSULE | Freq: Every day | ORAL | 1 refills | Status: DC

## 2019-06-15 NOTE — Progress Notes (Signed)
Patient received discharged instructions from PA, before she went home.

## 2019-06-15 NOTE — Progress Notes (Signed)
RLE venous duplex       has been completed. Preliminary results can be found under CV proc through chart review. Kensey Luepke, BS, RDMS, RVT   
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
## 2019-06-15 NOTE — Progress Notes (Signed)
Social Work Discharge Note   The overall goal for the admission was met for:   Discharge location: Yes - home with daughter, Hazel, who can provide assist  Length of Stay: Yes - 8 days  Discharge activity level: Yes - modified independent to supervision  Home/community participation: Yes  Services provided included: MD, RD, PT, OT, RN, Pharmacy and SW  Financial Services: Medicare and Medicaid  Follow-up services arranged: Home Health: PT, OT via [facility], DME: bari rolling walker, bari 3n1 and bari tub bench via [employer] and Patient/Family has no preference for HH/DME agencies  Comments (or additional information):      Contact info:   Daughter, Hazel @ [phone]  Patient/Family verbalized understanding of follow-up arrangements: Yes  Individual responsible for coordination of the follow-up plan: pt  Confirmed correct DME delivered: HOYLE, LUCY 06/15/2019    HOYLE, LUCY 
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
## 2019-06-15 NOTE — Plan of Care (Signed)
  Problem: Consults Goal: RH SPINAL CORD INJURY PATIENT EDUCATION Description:  See Patient Education module for education specifics.  06/15/2019 1058 by Glean Salen, RN Outcome: Completed/Met 06/15/2019 1058 by Glean Salen, RN Outcome: Adequate for Discharge   Problem: RH SKIN INTEGRITY Goal: RH STG SKIN FREE OF INFECTION/BREAKDOWN Description: MIN assist  06/15/2019 1058 by Glean Salen, RN Outcome: Completed/Met 06/15/2019 1058 by Glean Salen, RN Outcome: Adequate for Discharge Goal: RH STG ABLE TO PERFORM INCISION/WOUND CARE W/ASSISTANCE Description: STG Able To Perform Incision/Wound Care With min Assistance. 06/15/2019 1058 by Glean Salen, RN Outcome: Completed/Met 06/15/2019 1058 by Glean Salen, RN Outcome: Adequate for Discharge   Problem: RH SAFETY Goal: RH STG ADHERE TO SAFETY PRECAUTIONS W/ASSISTANCE/DEVICE Description: STG Adhere to Safety Precautions With min Assistance/Device. 06/15/2019 1058 by Glean Salen, RN Outcome: Completed/Met 06/15/2019 1058 by Glean Salen, RN Outcome: Adequate for Discharge Goal: RH STG DECREASED RISK OF FALL WITH ASSISTANCE Description: STG Decreased Risk of Fall With min Assistance. 06/15/2019 1058 by Glean Salen, RN Outcome: Completed/Met 06/15/2019 1058 by Glean Salen, RN Outcome: Adequate for Discharge   Problem: RH PAIN MANAGEMENT Goal: RH STG PAIN MANAGED AT OR BELOW PT'S PAIN GOAL Description: Pain <3  06/15/2019 1058 by Glean Salen, RN Outcome: Completed/Met 06/15/2019 1058 by Glean Salen, RN Outcome: Adequate for Discharge   Problem: RH KNOWLEDGE DEFICIT SCI Goal: RH STG INCREASE KNOWLEDGE OF SELF CARE AFTER SCI Description: Min assist 06/15/2019 1058 by Glean Salen, RN Outcome: Completed/Met 06/15/2019 1058 by Glean Salen, RN Outcome: Adequate for Discharge

## 2019-06-15 NOTE — Discharge Instructions (Signed)
Inpatient Rehab Discharge Instructions  Albertine Lafoy Discharge date and time:  06/15/19   Activities/Precautions/ Functional Status: Activity: no lifting, driving, or strenuous exercise for till cleared by MD. Diet: regular diet Wound Care: keep wound clean and dry    Functional status:  ___ No restrictions     ___ Walk up steps independently _X__ 24/7 supervision/assistance   ___ Walk up steps with assistance ___ Intermittent supervision/assistance  ___ Bathe/dress independently ___ Walk with walker     ___ Bathe/dress with assistance ___ Walk Independently    ___ Shower independently ___ Walk with assistance    _X__ Shower with assistance _X__ No alcohol     ___ Return to work/school ________   COMMUNITY REFERRALS UPON DISCHARGE:    Home Health:   PT     OT                           Agency:  [facility] Phone: [phone]   Medical Equipment:    Rolling walker, commode and tub bench                                        [place] @ [phone]  Special Instructions: 1. Can resume use of hydrocodone as needed for severe pain.  2. NO driving, pushing/pulling/lifting items over 5 lbs and no hyperextension of neck.  3. Will need dopplers of right leg rechecked in 2-4 weeks for follow up on DVT.    My questions have been answered and I understand these instructions. I will adhere to these goals and the provided educational materials after my discharge from the hospital.  Patient/Caregiver Signature _______________________________ Date __________  Clinician Signature _______________________________________ Date __________  Please bring this form and your medication list with you to all your follow-up doctor's appointments.
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
## 2019-06-15 NOTE — Plan of Care (Signed)
  Problem: Consults Goal: RH SPINAL CORD INJURY PATIENT EDUCATION Description:  See Patient Education module for education specifics.  Outcome: Adequate for Discharge   Problem: RH SKIN INTEGRITY Goal: RH STG SKIN FREE OF INFECTION/BREAKDOWN Description: MIN assist  Outcome: Adequate for Discharge Goal: RH STG ABLE TO PERFORM INCISION/WOUND CARE W/ASSISTANCE Description: STG Able To Perform Incision/Wound Care With min Assistance. Outcome: Adequate for Discharge   Problem: RH SAFETY Goal: RH STG ADHERE TO SAFETY PRECAUTIONS W/ASSISTANCE/DEVICE Description: STG Adhere to Safety Precautions With min Assistance/Device. Outcome: Adequate for Discharge Goal: RH STG DECREASED RISK OF FALL WITH ASSISTANCE Description: STG Decreased Risk of Fall With min Assistance. Outcome: Adequate for Discharge   Problem: RH PAIN MANAGEMENT Goal: RH STG PAIN MANAGED AT OR BELOW PT'S PAIN GOAL Description: Pain <3  Outcome: Adequate for Discharge   Problem: RH KNOWLEDGE DEFICIT SCI Goal: RH STG INCREASE KNOWLEDGE OF SELF CARE AFTER SCI Description: Min assist Outcome: Adequate for Discharge

## 2019-06-17 ENCOUNTER — Telehealth: Payer: Self-pay

## 2019-06-17 NOTE — Telephone Encounter (Signed)
Don PT [facility] called requesting verbal orders for 1xwk X 3wks followed by 1xwk X 1wk.  Called him back and approved verbal orders.  He stated also the patient is continuing to wear a neck brace thou didn't see any mention of it on the discharge notes or plan of care notes and is needing guidance on how to work around the brace or if she is supposed to be wearing it at all, please advise on this.

## 2019-06-20 NOTE — Telephone Encounter (Signed)
Notified Tommi Emery OT.

## 2019-06-20 NOTE — Telephone Encounter (Signed)
Per NS notes, no c-collar needed. Activity as tolerated. Does need to follow up with Dr. Venetia Constable in the office.

## 2019-06-21 ENCOUNTER — Telehealth: Payer: Self-pay

## 2019-06-21 NOTE — Telephone Encounter (Signed)
TRANSITIONAL CARE CALL attempt made 1st, no answer, left message to return call  Patient Name: Amy Mullins DOB: 1961/08/29 Appointment Date and Time: 06-28-2019 / 1040am With: Zella Ball first then Dr. Naaman Plummer  Transitional Care Questions   Questions for our staff to ask patients on Transitional care 48 hour phone call:   1. Are you/is patient experiencing any problems since coming home? Are there any questions regarding any aspect of care?   2. Are there any questions regarding medications administration/dosing? Are meds being taken as prescribed? Patient should review meds with caller to confirm   3. Have there been any falls?   4. Has Home Health been to the house and/or have they contacted you? If not, have you tried to contact them? Can we help you contact them?   5. Are bowels and bladder emptying properly? Are there any unexpected incontinence issues? If applicable, is patient following bowel/bladder programs?   6. Any fevers, problems with breathing, unexpected pain?   7. Are there any skin problems or new areas of breakdown?   8. Has the patient/family member arranged specialty MD follow up (ie cardiology/neurology/renal/surgical/etc)? Can we help arrange?   9. Does the patient need any other services or support that we can help arrange?   10. Are caregivers following through as expected in assisting the patient?   11. Has the patient quit smoking, drinking alcohol, or using drugs as recommended?     [place] [phone]

## 2019-06-21 NOTE — Telephone Encounter (Signed)
Transitional Care call-patient     1. Are you/is patient experiencing any problems since coming home? No Are there any questions regarding any aspect of care? No 2. Are there any questions regarding medications administration/dosing? Concerned about her blood clots, she was not given a blood thinner Are meds being taken as prescribed? Yes Patient should review meds with caller to confirm 3. Have there been any falls? No 4. Has Home Health been to the house and/or have they contacted you? Yes  If not, have you tried to contact them? Can we help you contact them? 5. Are bowels and bladder emptying properly? Yes Are there any unexpected incontinence issues? No If applicable, is patient following bowel/bladder programs? 6. Any fevers, problems with breathing, unexpected pain? No 7. Are there any skin problems or new areas of breakdown? No Has the patient/family member arranged specialty MD follow up (ie cardiology/neurology/renal/surgical/etc)?  Can we help arrange? 8. Does the patient need any other services or support that we can help arrange? Yes 9. Are caregivers following through as expected in assisting the patient? No 10. Has the patient quit smoking, drinking alcohol, or using drugs as recommended? Yes  Appointment time 10:40 am arrive time 10:20 am with Danella Sensing, NP on 06/28/2019 [place]

## 2019-06-22 ENCOUNTER — Other Ambulatory Visit: Payer: Self-pay | Admitting: Physical Medicine and Rehabilitation

## 2019-06-24 ENCOUNTER — Other Ambulatory Visit: Payer: Self-pay | Admitting: *Deleted

## 2019-06-24 MED ORDER — MECLIZINE HCL 25 MG PO TABS: ORAL_TABLET | ORAL | 0 refills | Status: DC

## 2019-06-27 ENCOUNTER — Telehealth: Payer: Self-pay | Admitting: Physical Medicine & Rehabilitation

## 2019-06-27 MED ORDER — MECLIZINE HCL 25 MG PO TABS: ORAL_TABLET | ORAL | 1 refills | Status: DC

## 2019-06-27 MED ORDER — POLYSACCHARIDE IRON COMPLEX 150 MG PO CAPS: 150.0000 mg | ORAL_CAPSULE | Freq: Every day | ORAL | 1 refills | Status: DC

## 2019-06-27 NOTE — Telephone Encounter (Signed)
Notified the [facility]. The wound did not need a dressing--no necrotic tissue. Amy Mullins is due to see [facility] tomorrow.

## 2019-06-27 NOTE — Telephone Encounter (Signed)
See last hospital progress note for details.....  Pt has BPPV. Therapy is limited by her cervical surgical precautions. Can have meclizine. rx written. Therapy once able to move neck more.  Wrote order for NIFEREX   Pt does have a blood clot and a follow up doppler needs to be ordered at [facility] visit. No a/c  Why are we putting a wet to dry dressing on a draining wound? Is there necrotic tissue on wound???

## 2019-06-27 NOTE — Telephone Encounter (Signed)
Shrada RN with [facility] went out for initial eval and there are a few medications that the patient didn't get.  Inferex and Meclizine.  Patient is feeling dizzy and lightheaded.  Also needs to know if patient needs to be on a blood thinner, questioning a blood clot.  Patient's sutures are draining and she would like to do wet to dry dressing on that.  Please call her at [phone].

## 2019-06-28 ENCOUNTER — Other Ambulatory Visit: Payer: Self-pay

## 2019-06-28 ENCOUNTER — Encounter: Payer: Self-pay | Admitting: Registered Nurse

## 2019-06-28 ENCOUNTER — Encounter: Payer: Medicare Other | Admitting: Gastroenterology

## 2019-06-28 ENCOUNTER — Encounter: Payer: Medicare Other | Attending: Registered Nurse | Admitting: Registered Nurse

## 2019-06-28 VITALS — BP 114/78 | HR 87 | Temp 97.0°F | Ht 67.0 in | Wt 362.0 lb

## 2019-06-28 DIAGNOSIS — S14109S Unspecified injury at unspecified level of cervical spinal cord, sequela: Secondary | ICD-10-CM | POA: Diagnosis present

## 2019-06-28 DIAGNOSIS — G8929 Other chronic pain: Secondary | ICD-10-CM | POA: Insufficient documentation

## 2019-06-28 DIAGNOSIS — I82441 Acute embolism and thrombosis of right tibial vein: Secondary | ICD-10-CM

## 2019-06-28 DIAGNOSIS — M5442 Lumbago with sciatica, left side: Secondary | ICD-10-CM | POA: Insufficient documentation
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
## 2019-06-29 DIAGNOSIS — S069X0D Unspecified intracranial injury without loss of consciousness, subsequent encounter: Secondary | ICD-10-CM

## 2019-06-29 DIAGNOSIS — S12690D Other displaced fracture of seventh cervical vertebra, subsequent encounter for fracture with routine healing: Secondary | ICD-10-CM | POA: Diagnosis not present

## 2019-06-29 DIAGNOSIS — M1711 Unilateral primary osteoarthritis, right knee: Secondary | ICD-10-CM

## 2019-06-29 DIAGNOSIS — S71012D Laceration without foreign body, left hip, subsequent encounter: Secondary | ICD-10-CM

## 2019-06-29 DIAGNOSIS — Z981 Arthrodesis status: Secondary | ICD-10-CM

## 2019-06-29 DIAGNOSIS — S130XXD Traumatic rupture of cervical intervertebral disc, subsequent encounter: Secondary | ICD-10-CM | POA: Diagnosis not present

## 2019-06-29 DIAGNOSIS — S12590D Other displaced fracture of sixth cervical vertebra, subsequent encounter for fracture with routine healing: Secondary | ICD-10-CM

## 2019-06-29 DIAGNOSIS — Q068 Other specified congenital malformations of spinal cord: Secondary | ICD-10-CM

## 2019-06-29 DIAGNOSIS — S14125D Central cord syndrome at C5 level of cervical spinal cord, subsequent encounter: Secondary | ICD-10-CM | POA: Diagnosis not present

## 2019-06-29 DIAGNOSIS — S22028D Other fracture of second thoracic vertebra, subsequent encounter for fracture with routine healing: Secondary | ICD-10-CM

## 2019-06-29 DIAGNOSIS — F17210 Nicotine dependence, cigarettes, uncomplicated: Secondary | ICD-10-CM

## 2019-06-29 DIAGNOSIS — M5432 Sciatica, left side: Secondary | ICD-10-CM

## 2019-06-29 DIAGNOSIS — G8929 Other chronic pain: Secondary | ICD-10-CM

## 2019-06-29 DIAGNOSIS — Z9181 History of falling: Secondary | ICD-10-CM

## 2019-06-29 DIAGNOSIS — S81011D Laceration without foreign body, right knee, subsequent encounter: Secondary | ICD-10-CM

## 2019-06-29 DIAGNOSIS — S22018D Other fracture of first thoracic vertebra, subsequent encounter for fracture with routine healing: Secondary | ICD-10-CM

## 2019-07-06 ENCOUNTER — Ambulatory Visit (HOSPITAL_COMMUNITY): Payer: Medicare Other

## 2019-07-06 ENCOUNTER — Telehealth: Payer: Self-pay | Admitting: *Deleted

## 2019-07-06 NOTE — Telephone Encounter (Signed)
Spoke to the patient to attempt to scheduled the patient for her colonoscopy at [facility]. The patient declined, reporting a scheduling conflict. This RN reported to the patient that she receive a call in August to possibly schedule for September. Patient verbalized understanding. Nothing further at the time of the call.

## 2019-07-08 ENCOUNTER — Ambulatory Visit (HOSPITAL_COMMUNITY): Payer: Medicare Other

## 2019-07-11 NOTE — Progress Notes (Signed)
*Pt left office before being seen by MD due to abdominal pain*   [facility] Note  07/12/2019     CHIEF COMPLAINT Patient presents for No chief complaint on file.   HISTORY OF PRESENT ILLNESS: Amy Mullins is a 58 y.o. female who presents to the clinic today for:   pt states she feels like her vision is better, she denies new FOL or floaters  Referring physician: No referring provider defined for this encounter.  HISTORICAL INFORMATION:   Selected notes from the MEDICAL RECORD NUMBER Referred by Dr. Mikeal Hawthorne for concern of hemorrhagic PVD LEE:  Ocular Hx- PMH-    CURRENT MEDICATIONS: No current outpatient medications on file. (Ophthalmic Drugs)   No current facility-administered medications for this visit.  (Ophthalmic Drugs)   Current Outpatient Medications (Other)  Medication Sig  . cyclobenzaprine (FLEXERIL) 10 MG tablet Take 1 tablet (10 mg total) by mouth 3 (three) times daily as needed for muscle spasms.  Marland Kitchen gabapentin (NEURONTIN) 400 MG capsule Take 800 mg by mouth daily at 6 PM.  . iron polysaccharides (NIFEREX) 150 MG capsule Take 1 capsule (150 mg total) by mouth daily.  . meclizine (ANTIVERT) 25 MG tablet TAKE 1 TABLET BY MOUTH THREE TIMES DAILY AS NEEDED FOR DIZZINESS  . polyethylene glycol (MIRALAX / GLYCOLAX) 17 g packet Take 17 g by mouth daily.  . traZODone (DESYREL) 50 MG tablet Take 50 mg by mouth at bedtime as needed for sleep.    No current facility-administered medications for this visit.  (Other)      REVIEW OF SYSTEMS:    ALLERGIES No Known Allergies  PAST MEDICAL HISTORY Past Medical History:  Diagnosis Date  . Abnormal vaginal bleeding   . Chronic back pain   . Obesity   . Posterior vitreous detachment of right eye   . Right knee DJD   . Sciatica   . Sciatica of left side    Past Surgical History:  Procedure Laterality Date  . ANTERIOR CERVICAL DECOMP/DISCECTOMY FUSION N/A 06/04/2019   Procedure:  ANTERIOR CERVICAL DECOMPRESSION/DISCECTOMY FUSION 1 LEVEL;  Surgeon: Judith Part, MD;  Location: [place];  Service: Neurosurgery;  Laterality: N/A;  anterior     FAMILY HISTORY Family History  Problem Relation Age of Onset  . Prostate cancer Father     SOCIAL HISTORY Social History   Tobacco Use  . Smoking status: Never Smoker  . Smokeless tobacco: Never Used  Substance Use Topics  . Alcohol use: Never    Frequency: Never  . Drug use: Never         OPHTHALMIC EXAM:   Not recorded      IMAGING AND PROCEDURES  Imaging and Procedures for @TODAY @           ASSESSMENT/PLAN:    ICD-10-CM   1. Posterior vitreous detachment of right eye  H43.811   2. Vitreous hemorrhage of right eye ([place])  H43.11   3. Retinal edema  H35.81 OCT, Retina - OU - Both Eyes  4. Combined forms of age-related cataract of both eyes  H25.813     1,2. Hemorrhagic PVD OD -- improving  - Onset mid Feb 2020 -- presented to Dr. Ok Anis / [facility]  - Mild VH now clearing / improving / settling inferiorly  - No RT or RD on 360 scleral depressed exam  - Discussed findings and prognosis  - Reviewed s/s of RT/RD  - Strict return precautions for any  such RT/RD signs/symptoms  - VH precautions reviewed -- minimize activities, keep head elevated, avoid ASA/NSAIDs/blood thinners as able  - F/U 3-4 months  3. No retinal edema on exam or OCT  4. Mixed cataracts OU  - The symptoms of cataract, surgical options, and treatments and risks were discussed with patient.  - discussed diagnosis and progression  - not yet visually significant  - monitor for now   Ophthalmic Meds Ordered this visit:  No orders of the defined types were placed in this encounter.      No follow-ups on file.  There are no Patient Instructions on file for this visit.   Explained the diagnoses, plan, and follow up with the patient and they expressed understanding.  Patient expressed understanding  of the importance of proper follow up care.   This document serves as a record of services personally performed by Karie ChimeraBrian G. Froilan Mclean, MD, PhD. It was created on their behalf by Laurian BrimAmanda Brown, OA, an ophthalmic assistant. The creation of this record is the provider's dictation and/or activities during the visit.    Electronically signed by: Laurian BrimAmanda Brown, OA  08.03.2020 12:40 PM      Karie ChimeraBrian G. Dewon Mendizabal, M.D., Ph.D. Diseases & Surgery of the Retina and Vitreous [facility]     Abbreviations: M myopia (nearsighted); A astigmatism; H hyperopia (farsighted); P presbyopia; Mrx spectacle prescription;  CTL contact lenses; OD right eye; OS left eye; OU both eyes  XT exotropia; ET esotropia; PEK punctate epithelial keratitis; PEE punctate epithelial erosions; DES dry eye syndrome; MGD meibomian gland dysfunction; ATs artificial tears; PFAT's preservative free artificial tears; NSC nuclear sclerotic cataract; PSC posterior subcapsular cataract; ERM epi-retinal membrane; PVD posterior vitreous detachment; RD retinal detachment; DM diabetes mellitus; DR diabetic retinopathy; NPDR non-proliferative diabetic retinopathy; PDR proliferative diabetic retinopathy; CSME clinically significant macular edema; DME diabetic macular edema; dbh dot blot hemorrhages; CWS cotton wool spot; POAG primary open angle glaucoma; C/D cup-to-disc ratio; HVF humphrey visual field; GVF goldmann visual field; OCT optical coherence tomography; IOP intraocular pressure; BRVO Branch retinal vein occlusion; CRVO central retinal vein occlusion; CRAO central retinal artery occlusion; BRAO branch retinal artery occlusion; RT retinal tear; SB scleral buckle; PPV pars plana vitrectomy; VH Vitreous hemorrhage; PRP panretinal laser photocoagulation; IVK intravitreal kenalog; VMT vitreomacular traction; MH Macular hole;  NVD neovascularization of the disc; NVE neovascularization elsewhere; AREDS age related eye disease study; ARMD  age related macular degeneration; POAG primary open angle glaucoma; EBMD epithelial/anterior basement membrane dystrophy; ACIOL anterior chamber intraocular lens; IOL intraocular lens; PCIOL posterior chamber intraocular lens; Phaco/IOL phacoemulsification with intraocular lens placement; PRK photorefractive keratectomy; LASIK laser assisted in situ keratomileusis; HTN hypertension; DM diabetes mellitus; COPD chronic obstructive pulmonary disease

## 2019-07-12 ENCOUNTER — Encounter (INDEPENDENT_AMBULATORY_CARE_PROVIDER_SITE_OTHER): Payer: Medicare Other | Admitting: Ophthalmology

## 2019-07-12 ENCOUNTER — Encounter (HOSPITAL_COMMUNITY): Payer: Self-pay | Admitting: Emergency Medicine

## 2019-07-12 ENCOUNTER — Emergency Department (HOSPITAL_COMMUNITY): Payer: Medicare Other

## 2019-07-12 ENCOUNTER — Emergency Department (HOSPITAL_COMMUNITY)
Admission: EM | Admit: 2019-07-12 | Discharge: 2019-07-13 | Disposition: A | Discharge status: Home / Self Care | Payer: Medicare Other | Attending: Emergency Medicine | Admitting: Emergency Medicine

## 2019-07-12 ENCOUNTER — Other Ambulatory Visit: Payer: Self-pay

## 2019-07-12 ENCOUNTER — Encounter (INDEPENDENT_AMBULATORY_CARE_PROVIDER_SITE_OTHER): Payer: Self-pay | Admitting: Ophthalmology

## 2019-07-12 DIAGNOSIS — N3090 Cystitis, unspecified without hematuria: Secondary | ICD-10-CM | POA: Diagnosis not present

## 2019-07-12 DIAGNOSIS — Z79899 Other long term (current) drug therapy: Secondary | ICD-10-CM | POA: Diagnosis not present

## 2019-07-12 DIAGNOSIS — R109 Unspecified abdominal pain: Secondary | ICD-10-CM | POA: Diagnosis present

## 2019-07-12 LAB — CBC WITH DIFFERENTIAL/PLATELET
Abs Immature Granulocytes: 0.13 10*3/uL — ABNORMAL HIGH (ref 0.00–0.07)
Basophils Absolute: 0 10*3/uL (ref 0.0–0.1)
Basophils Relative: 0 %
Eosinophils Absolute: 0 10*3/uL (ref 0.0–0.5)
Eosinophils Relative: 0 %
HCT: 39.6 % (ref 36.0–46.0)
Hemoglobin: 12 g/dL (ref 12.0–15.0)
Immature Granulocytes: 1 %
Lymphocytes Relative: 11 %
Lymphs Abs: 2.2 10*3/uL (ref 0.7–4.0)
MCH: 21.9 pg — ABNORMAL LOW (ref 26.0–34.0)
MCHC: 30.3 g/dL (ref 30.0–36.0)
MCV: 72.3 fL — ABNORMAL LOW (ref 80.0–100.0)
Monocytes Absolute: 0.9 10*3/uL (ref 0.1–1.0)
Monocytes Relative: 4 %
Neutro Abs: 16 10*3/uL — ABNORMAL HIGH (ref 1.7–7.7)
Neutrophils Relative %: 84 %
Platelets: 290 10*3/uL (ref 150–400)
RBC: 5.48 MIL/uL — ABNORMAL HIGH (ref 3.87–5.11)
RDW: 15.2 % (ref 11.5–15.5)
WBC: 19.2 10*3/uL — ABNORMAL HIGH (ref 4.0–10.5)
nRBC: 0 % (ref 0.0–0.2)

## 2019-07-12 LAB — COMPREHENSIVE METABOLIC PANEL
ALT: 25 U/L (ref 0–44)
AST: 21 U/L (ref 15–41)
Albumin: 3.7 g/dL (ref 3.5–5.0)
Alkaline Phosphatase: 52 U/L (ref 38–126)
Anion gap: 14 (ref 5–15)
BUN: 7 mg/dL (ref 6–20)
CO2: 23 mmol/L (ref 22–32)
Calcium: 9.6 mg/dL (ref 8.9–10.3)
Chloride: 96 mmol/L — ABNORMAL LOW (ref 98–111)
Creatinine, Ser: 0.95 mg/dL (ref 0.44–1.00)
GFR calc Af Amer: >60 mL/min (ref >60)
GFR calc non Af Amer: >60 mL/min (ref >60)
Glucose, Bld: 179 mg/dL — ABNORMAL HIGH (ref 70–99)
Potassium: 3.5 mmol/L (ref 3.5–5.1)
Sodium: 133 mmol/L — ABNORMAL LOW (ref 135–145)
Total Bilirubin: 2.3 mg/dL — ABNORMAL HIGH (ref 0.3–1.2)
Total Protein: 8 g/dL (ref 6.5–8.1)

## 2019-07-12 LAB — URINALYSIS, ROUTINE W REFLEX MICROSCOPIC
Bilirubin Urine: NEGATIVE
Glucose, UA: NEGATIVE mg/dL
Ketones, ur: NEGATIVE mg/dL
Nitrite: POSITIVE — AB
Protein, ur: NEGATIVE mg/dL
Specific Gravity, Urine: 1.03 (ref 1.005–1.030)
WBC, UA: >50 WBC/hpf — ABNORMAL HIGH (ref 0–5)
pH: 5 (ref 5.0–8.0)

## 2019-07-12 LAB — LACTIC ACID, PLASMA
Lactic Acid, Venous: 1.9 mmol/L (ref 0.5–1.9)
Lactic Acid, Venous: 2 mmol/L (ref 0.5–1.9)
Lactic Acid, Venous: 1.1 mmol/L (ref 0.5–1.9)

## 2019-07-12 LAB — POC URINE PREG, ED: Preg Test, Ur: NEGATIVE

## 2019-07-12 LAB — CBC
HCT: 39.5 % (ref 36.0–46.0)
Hemoglobin: 12.1 g/dL (ref 12.0–15.0)
MCH: 22.1 pg — ABNORMAL LOW (ref 26.0–34.0)
MCHC: 30.6 g/dL (ref 30.0–36.0)
MCV: 72.2 fL — ABNORMAL LOW (ref 80.0–100.0)
Platelets: 294 10*3/uL (ref 150–400)
RBC: 5.47 MIL/uL — ABNORMAL HIGH (ref 3.87–5.11)
RDW: 14.8 % (ref 11.5–15.5)
WBC: 19.3 10*3/uL — ABNORMAL HIGH (ref 4.0–10.5)
nRBC: 0 % (ref 0.0–0.2)

## 2019-07-12 LAB — LIPASE, BLOOD: Lipase: 20 U/L (ref 11–51)

## 2019-07-12 MED ORDER — MORPHINE SULFATE (PF) 4 MG/ML IV SOLN
4.0000 mg | Freq: Once | INTRAVENOUS | Status: AC
  Administered 2019-07-12: 4 mg via INTRAVENOUS
  Filled 2019-07-12: qty 1

## 2019-07-12 MED ORDER — SODIUM CHLORIDE 0.9 % IV SOLN
1.0000 g | Freq: Once | INTRAVENOUS | Status: AC
  Administered 2019-07-12: 21:00:00 1 g via INTRAVENOUS
  Filled 2019-07-12: qty 10

## 2019-07-12 MED ORDER — SODIUM CHLORIDE 0.9 % IV BOLUS
1000.0000 mL | Freq: Once | INTRAVENOUS | Status: AC
  Administered 2019-07-12: 17:00:00 1000 mL via INTRAVENOUS

## 2019-07-12 MED ORDER — IBUPROFEN 400 MG PO TABS
600.0000 mg | ORAL_TABLET | Freq: Once | ORAL | Status: AC
  Administered 2019-07-12: 600 mg via ORAL
  Filled 2019-07-12: qty 1

## 2019-07-12 MED ORDER — IOHEXOL 300 MG/ML  SOLN
100.0000 mL | Freq: Once | INTRAMUSCULAR | Status: AC | PRN
  Administered 2019-07-12: 100 mL via INTRAVENOUS

## 2019-07-12 MED ORDER — SODIUM CHLORIDE 0.9% FLUSH: 3.0000 mL | Freq: Once | INTRAVENOUS | Status: DC

## 2019-07-12 MED ORDER — CEPHALEXIN 500 MG PO CAPS: 500.0000 mg | ORAL_CAPSULE | Freq: Two times a day (BID) | ORAL | 0 refills | Status: AC

## 2019-07-12 MED ORDER — ONDANSETRON HCL 4 MG/2ML IJ SOLN
4.0000 mg | Freq: Once | INTRAMUSCULAR | Status: AC
  Administered 2019-07-12: 17:00:00 4 mg via INTRAVENOUS
  Filled 2019-07-12: qty 2

## 2019-07-12 NOTE — ED Provider Notes (Signed)
[facility] EM[facility]GENCY [facility]MENT Provider Note   CSN: [id number] Arrival date & time: 07/12/19  1055    History   Chief Complaint Chief Complaint  Patient presents with   Abdominal Pain    HPI Amy Mullins is a 58 y.o. female.     HPI   Amy Mullins is a 58 y.o. female, with a history of chronic back pain, obesity, presenting to the ED with abdominal pain beginning last night. The location of the pain changes each time she is asked, but it seems as though her pain started periumbilical and across the midabdomen, severe, "feels like something is trying to crawl out of me. It's worse than labor pains," now present throughout the abdomen. Accompanied by an instance of burning urination and hematuria. Loss of appetite last three days.   Last BM was three days ago. She intermittently takes hydrocodone for chronic back pain. She is currently having back pain, but states this is consistent with her history of sciatica.    Denies fever/chills, N/V/D, vaginal bleeding, vaginal discharge, chest pain, shortness of breath, flank pain, neuro deficits, or any other complaints.     Past Medical History:  Diagnosis Date   Abnormal vaginal bleeding    Chronic back pain    Obesity    Posterior vitreous detachment of right eye    Right knee DJD    Sciatica    Sciatica of left side     Patient Active Problem List   Diagnosis Date Noted   Acute deep vein thrombosis (DVT) of right tibial vein (HCC)    TBI (traumatic brain injury) (HCC)    Acute blood loss anemia    Acute lower UTI    Cervical spinal cord injury, sequela (HCC) 06/10/2019   BPPV (benign paroxysmal positional vertigo), unspecified laterality 06/10/2019   Abnormal uterine bleeding (AUB) 02/25/2019    Past Surgical History:  Procedure Laterality Date   ANTERIOR CERVICAL DECOMP/DISCECTOMY FUSION N/A 06/04/2019   Procedure: ANTERIOR CERVICAL DECOMPRESSION/DISCECTOMY FUSION 1 LEVEL;   Surgeon: Jadene Pierinistergard, Thomas A, MD;  Location: [facility] OR;  Service: Neurosurgery;  Laterality: N/A;  anterior      OB History   No obstetric history on file.      Home Medications    Prior to Admission medications   Medication Sig Start Date End Date Taking? Authorizing Provider  cyclobenzaprine (FLEXERIL) 10 MG tablet Take 1 tablet (10 mg total) by mouth 3 (three) times daily as needed for muscle spasms. 06/15/19  Yes Love, Evlyn KannerPamela S, PA-C  furosemide (LASIX) 20 MG tablet Take 20 mg by mouth daily.   Yes [provider]  gabapentin (NEURONTIN) 400 MG capsule Take 800 mg by mouth 2 (two) times daily as needed (for nerve pain).  03/30/19  Yes [provider]  HYDROcodone-acetaminophen (NORCO) 10-325 MG tablet Take 1 tablet by mouth every 6 (six) hours as needed (for pain).    Yes [provider]  meclizine (ANTIVERT) 25 MG tablet TAKE 1 TABLET BY MOUTH THREE TIMES DAILY AS NEEDED FOR DIZZINESS Patient taking differently: Take 25 mg by mouth 3 (three) times daily as needed for dizziness.  06/27/19  Yes Ranelle OysterSwartz, Zachary T, MD  phentermine (ADIPEX-P) 37.5 MG tablet Take 37.5 mg by mouth daily before breakfast.   Yes [provider]  traZODone (DESYREL) 50 MG tablet Take 50 mg by mouth at bedtime as needed for sleep.  03/30/19  Yes [provider]  cephALEXin (KEFLEX) 500 MG capsule Take 1 capsule (  500 mg total) by mouth 2 (two) times daily for 7 days. 07/12/19 07/19/19  Isabellarose Kope C, PA-C  iron polysaccharides (NIFEREX) 150 MG capsule Take 1 capsule (150 mg total) by mouth daily. Patient not taking: Reported on 07/12/2019 06/27/19   Ranelle OysterSwartz, Zachary T, MD  polyethylene glycol (MIRALAX / GLYCOLAX) 17 g packet Take 17 g by mouth daily. Patient not taking: Reported on 07/12/2019 06/16/19   Love, Evlyn KannerPamela S, PA-C    Family History Family History  Problem Relation Age of Onset   Prostate cancer Father     Social History Social History   Tobacco Use   Smoking status:  Never Smoker   Smokeless tobacco: Never Used  Substance Use Topics   Alcohol use: Never    Frequency: Never   Drug use: Never     Allergies   Patient has no known allergies.   Review of Systems Review of Systems  Constitutional: Negative for chills, diaphoresis and fever.  Respiratory: Negative for cough and shortness of breath.   Cardiovascular: Negative for chest pain.  Gastrointestinal: Positive for abdominal pain and constipation. Negative for blood in stool, diarrhea, nausea and vomiting.  Genitourinary: Positive for dysuria and hematuria. Negative for flank pain, vaginal bleeding and vaginal discharge.  Neurological: Negative for syncope, weakness and numbness.  All other systems reviewed and are negative.    Physical Exam Updated Vital Signs BP 131/88    Pulse (!) 115    Temp 100.3 F (37.9 C) (Oral)    Resp 16    Ht 5\' 7"  (1.702 m)    Wt (!) 158.8 kg    LMP 06/20/2019    SpO2 100%    BMI 54.82 kg/m   Physical Exam Vitals signs and nursing note reviewed.  Constitutional:      General: She is in acute distress (pain).     Appearance: She is well-developed. She is obese. She is not diaphoretic.  HENT:     Head: Normocephalic and atraumatic.     Mouth/Throat:     Mouth: Mucous membranes are moist.     Pharynx: Oropharynx is clear.  Eyes:     Conjunctiva/sclera: Conjunctivae normal.  Neck:     Musculoskeletal: Neck supple.  Cardiovascular:     Rate and Rhythm: Normal rate and regular rhythm.     Pulses: Normal pulses.          Radial pulses are 2+ on the right side and 2+ on the left side.       Posterior tibial pulses are 2+ on the right side and 2+ on the left side.     Heart sounds: Normal heart sounds.     Comments: Tactile temperature in the extremities appropriate and equal bilaterally. Pulmonary:     Effort: Pulmonary effort is normal. No respiratory distress.     Breath sounds: Normal breath sounds.  Abdominal:     Palpations: Abdomen is soft.      Tenderness: There is generalized abdominal tenderness (Seems to be worse in the lower abdomen). There is no guarding.  Musculoskeletal:     Right lower leg: No edema.     Left lower leg: No edema.  Lymphadenopathy:     Cervical: No cervical adenopathy.  Skin:    General: Skin is warm and dry.  Neurological:     Mental Status: She is alert.     Comments: Sensation grossly intact to light touch in the lower extremities bilaterally. No saddle anesthesias. Strength 5/5 in the bilateral  lower extremities.  Psychiatric:        Mood and Affect: Mood and affect normal.        Speech: Speech normal.        Behavior: Behavior normal.      ED Treatments / Results  Labs (all labs ordered are listed, but only abnormal results are displayed) Labs Reviewed  COMPREHENSIVE METABOLIC PANEL - Abnormal; Notable for the following components:      Result Value   Sodium 133 (*)    Chloride 96 (*)    Glucose, Bld 179 (*)    Total Bilirubin 2.3 (*)    All other components within normal limits  CBC - Abnormal; Notable for the following components:   WBC 19.3 (*)    RBC 5.47 (*)    MCV 72.2 (*)    MCH 22.1 (*)    All other components within normal limits  URINALYSIS, ROUTINE W REFLEX MICROSCOPIC - Abnormal; Notable for the following components:   APPearance CLOUDY (*)    Hgb urine dipstick SMALL (*)    Nitrite POSITIVE (*)    Leukocytes,Ua LARGE (*)    WBC, UA >50 (*)    Bacteria, UA MANY (*)    All other components within normal limits  LACTIC ACID, PLASMA - Abnormal; Notable for the following components:   Lactic Acid, Venous 2.0 (*)    All other components within normal limits  CBC WITH DIFFERENTIAL/PLATELET - Abnormal; Notable for the following components:   WBC 19.2 (*)    RBC 5.48 (*)    MCV 72.3 (*)    MCH 21.9 (*)    Neutro Abs 16.0 (*)    Abs Immature Granulocytes 0.13 (*)    All other components within normal limits  URINE CULTURE  CULTURE, BLOOD (ROUTINE X 2)  CULTURE,  BLOOD (ROUTINE X 2)  LIPASE, BLOOD  LACTIC ACID, PLASMA  LACTIC ACID, PLASMA  DIFFERENTIAL  POC URINE PREG, ED    EKG None  Radiology Ct Abdomen Pelvis W Contrast  Result Date: 07/12/2019 CLINICAL DATA:  Acute abdominal pain. EXAM: CT ABDOMEN AND PELVIS WITH CONTRAST TECHNIQUE: Multidetector CT imaging of the abdomen and pelvis was performed using the standard protocol following bolus administration of intravenous contrast. CONTRAST:  100mL OMNIPAQUE IOHEXOL 300 MG/ML  SOLN COMPARISON:  None. FINDINGS: Lower chest: 1.6 cm subpleural rounded atelectasis versus pulmonary nodule in the left lung base. Mild pleural thickening. Hepatobiliary: Hepatic steatosis. Cholelithiasis with the largest calculus measuring 2.7 cm. Normal distention of the gallbladder. No biliary ductal dilation. Pancreas: Unremarkable. No pancreatic ductal dilatation or surrounding inflammatory changes. Spleen: Normal in size without focal abnormality. Adrenals/Urinary Tract: Adrenal glands are unremarkable. Kidneys are normal, without renal calculi, focal lesion, or hydronephrosis. Bladder is unremarkable. Stomach/Bowel: Stomach is within normal limits. Appendix appears normal. No evidence of bowel wall thickening, distention, or inflammatory changes. Vascular/Lymphatic: No significant vascular findings are present. No enlarged abdominal or pelvic lymph nodes. Reproductive: Enlarged uterine fundus. Tubular cystic structure within the right adnexa. Other: None. Musculoskeletal: Spondylosis of the lumbosacral spine. IMPRESSION: 1. Hepatic steatosis. 2. Cholelithiasis without CT evidence of acute cholecystitis. 3. Tubular cystic structure within the right adnexa, which may represent hydrosalpinx or of ovarian cysts. Bulbous uterine fundus. Nonemergent pelvic ultrasound may be considered for further evaluation. 4. 1.6 cm subpleural rounded atelectasis versus pulmonary nodule in the left lung base. Repeat CT of the chest in 3 months may be  considered. Electronically Signed   By: Ulanda Edisonobrinka  Dimitrova M.D.  On: 07/12/2019 18:07    Procedures Procedures (including critical care time)  Medications Ordered in ED Medications  sodium chloride flush (NS) 0.9 % injection 3 mL (has no administration in time range)  ondansetron (ZOFRAN) injection 4 mg (4 mg Intravenous Given 07/12/19 1716)  morphine 4 MG/ML injection 4 mg (4 mg Intravenous Given 07/12/19 1716)  sodium chloride 0.9 % bolus 1,000 mL (1,000 mLs Intravenous New Bag/Given 07/12/19 1715)  ibuprofen (ADVIL) tablet 600 mg (600 mg Oral Given 07/12/19 1922)  iohexol (OMNIPAQUE) 300 MG/ML solution 100 mL (100 mLs Intravenous Contrast Given 07/12/19 1733)  cefTRIAXone (ROCEPHIN) 1 g in sodium chloride 0.9 % 100 mL IVPB (0 g Intravenous Stopped 07/12/19 2123)     Initial Impression / Assessment and Plan / ED Course  I have reviewed the triage vital signs and the nursing notes.  Pertinent labs & imaging results that were available during my care of the patient were reviewed by me and considered in my medical decision making (see chart for details).  Clinical Course as of Jul 11 2358  Tue Jul 12, 2019  1902 Patient chatting on the phone as I walked in.  Discussed lab and imaging results with the patient.  She states she is feeling better and was able to get some sleep.   [SJ]    Clinical Course User Index [SJ] Draylen Lobue C, PA-C       Patient presents with abdominal pain and urinary symptoms.  Noted to be mildly tachycardic and mildly febrile.  She is nontoxic-appearing and not hypotensive. She does have a leukocytosis and mild lactic acidosis.  Lactic acidosis cleared with IV fluids.  She has evidence of infection on her UA. Patient overall improved during her ED course and I think she is appropriate for discharge. The patient was given instructions for home care as well as strict return precautions. Patient voices understanding of these instructions, accepts the plan, and is  comfortable with discharge.   Findings and plan of care discussed with Dorie Rank, MD.    Vitals:   07/12/19 1545 07/12/19 1608 07/12/19 1830 07/12/19 2123  BP: 129/76 129/76 108/69 (!) 107/57  Pulse: (!) 111 (!) 111 (!) 110 99  Resp:  18 (!) 23 16  Temp:      TempSrc:      SpO2: 99% 100% 99% 100%  Weight:      Height:         Final Clinical Impressions(s) / ED Diagnoses   Final diagnoses:  Cystitis    ED Discharge Orders         Ordered    cephALEXin (KEFLEX) 500 MG capsule  2 times daily     07/12/19 1945           Layla Maw 07/13/19 0005    Dorie Rank, MD 07/20/19 [phone]

## 2019-07-12 NOTE — Discharge Instructions (Addendum)
There was evidence of UTI on the urinalysis today. Please take all of your antibiotics until finished!   You may develop abdominal discomfort or diarrhea from the antibiotic.  You may help offset this with probiotics which you can buy or get in yogurt. Do not eat or take the probiotics until 2 hours after your antibiotic.  For pain or fever, you may take some of the below medications. Antiinflammatory medications: Take 600 mg of ibuprofen every 6 hours or 440 mg (over the counter dose) to 500 mg (prescription dose) of naproxen every 12 hours for the next 3 days. After this time, these medications may be used as needed for pain. Take these medications with food to avoid upset stomach. Choose only one of these medications, do not take them together. Acetaminophen (generic for Tylenol): Should you continue to have additional pain while taking the ibuprofen or naproxen, you may add in acetaminophen as needed. Your daily total maximum amount of acetaminophen from all sources should be limited to 4000mg /day for persons without liver problems, or 2000mg /day for those with liver problems.  Follow up with your primary care provider on this matter.   Return to the emergency department for worsening symptoms.   There were some things that will need follow up: Bilirubin: You had some elevation in your Total bilirubin to 2.3.  We recommend you have this retested in the next week or two. Possible lung abnormality: There was an abnormality in the left lower lung that raised a question of atelectasis versus nodule.  Recommendation for follow-up CT scan of the chest in 3 months.  This can be arranged through your primary care provider. Uterine and pelvic abnormality: You had evidence of bulbous uterus and possible hydrosalpinx on the CT scan today.  Recommendation for follow-up nonemergent ultrasound.  This can be performed through OB/GYN.

## 2019-07-12 NOTE — ED Triage Notes (Signed)
Pt reports lower abd pain that has been going on for 2 days now. Pt reports taking pain medication without eating. Pt also reports now bowel movement for the past 3 days.

## 2019-07-12 NOTE — ED Notes (Signed)
I clicked off orders that had resulted. I did not draw blood.
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
## 2019-07-13 NOTE — Progress Notes (Signed)
This encounter was created in error - please disregard.

## 2019-07-14 LAB — URINE CULTURE: Culture: >=100000 — AB

## 2019-07-15 ENCOUNTER — Telehealth: Payer: Self-pay | Admitting: *Deleted

## 2019-07-15 NOTE — Telephone Encounter (Signed)
Amy Mullins from [facility] called to get an ext 2wk2 SN.  Amy Mullins was in the ED recently with cystitis and has been constipated in spite of taking miralax 2x day.  She has not had a BM in 6 days. She was given keflex in the ED. Amy Mullins's number is [phone]

## 2019-07-15 NOTE — Telephone Encounter (Signed)
Post ED Visit - Positive Culture Follow-up  Culture report reviewed by antimicrobial stewardship pharmacist: [place] Team []  Elenor Quinones, Pharm.D. []  Heide Guile, Pharm.D., BCPS AQ-ID []  Parks Neptune, Pharm.D., BCPS []  Alycia Rossetti, Pharm.D., BCPS []  [place], Pharm.D., BCPS, AAHIVP []  Legrand Como, Pharm.D., BCPS, AAHIVP []  Salome Arnt, PharmD, BCPS []  Johnnette Gourd, PharmD, BCPS []  Hughes Better, PharmD, BCPS []  Leeroy Cha, PharmD []  Laqueta Linden, PharmD, BCPS []  Albertina Parr, PharmD  [place] Team []  Leodis Sias, PharmD []  Lindell Spar, PharmD []  Royetta Asal, PharmD []  Graylin Shiver, Rph []  Rema Fendt) Glennon Mac, PharmD []  Arlyn Dunning, PharmD []  Netta Cedars, PharmD []  Dia Sitter, PharmD []  Leone Haven, PharmD []  Gretta Arab, PharmD []  Theodis Shove, PharmD []  Peggyann Juba, PharmD []  Reuel Boom, PharmD Robbie Louis, Pharm D  Positive urine culture Treated with Cephalexin, organism sensitive to the same and no further patient follow-up is required at this time.  Harlon Flor [facility] 07/15/2019, 10:38 AM

## 2019-07-16 NOTE — Telephone Encounter (Signed)
Called and spoke with patient. Recommended senna-s 2 at bedtime. She had a bm this morning with MOM.   Also recommended ample fluids, fruits, and veggies as part of diet

## 2019-07-17 LAB — CULTURE, BLOOD (ROUTINE X 2)
Culture: NO GROWTH
Culture: NO GROWTH
Special Requests: ADEQUATE

## 2019-07-18 ENCOUNTER — Encounter: Payer: Self-pay | Admitting: *Deleted

## 2019-07-18 ENCOUNTER — Telehealth: Payer: Self-pay | Admitting: *Deleted

## 2019-07-18 ENCOUNTER — Other Ambulatory Visit: Payer: Self-pay | Admitting: *Deleted

## 2019-07-18 DIAGNOSIS — Z1211 Encounter for screening for malignant neoplasm of colon: Secondary | ICD-10-CM

## 2019-07-18 NOTE — Telephone Encounter (Signed)
Spoke to the patient, she has been scheduled for the following:  08/09/2019 at 9:00 am previsit with the nurse 08/19/2019 COVID screen 08/23/2019 at 9:30 am colonoscopy at [facility]

## 2019-07-19 ENCOUNTER — Other Ambulatory Visit: Payer: Self-pay

## 2019-07-19 ENCOUNTER — Ambulatory Visit (HOSPITAL_COMMUNITY)
Admission: RE | Admit: 2019-07-19 | Discharge: 2019-07-19 | Disposition: A | Discharge status: Home / Self Care | Payer: Medicare Other | Source: Ambulatory Visit | Attending: Registered Nurse | Admitting: Registered Nurse

## 2019-07-19 DIAGNOSIS — I82441 Acute embolism and thrombosis of right tibial vein: Secondary | ICD-10-CM | POA: Diagnosis present

## 2019-07-20 ENCOUNTER — Telehealth: Payer: Self-pay

## 2019-07-20 DIAGNOSIS — S22028D Other fracture of second thoracic vertebra, subsequent encounter for fracture with routine healing: Secondary | ICD-10-CM

## 2019-07-20 DIAGNOSIS — S12690D Other displaced fracture of seventh cervical vertebra, subsequent encounter for fracture with routine healing: Secondary | ICD-10-CM | POA: Diagnosis not present

## 2019-07-20 DIAGNOSIS — Z9181 History of falling: Secondary | ICD-10-CM

## 2019-07-20 DIAGNOSIS — S12590D Other displaced fracture of sixth cervical vertebra, subsequent encounter for fracture with routine healing: Secondary | ICD-10-CM | POA: Diagnosis not present

## 2019-07-20 DIAGNOSIS — S22018D Other fracture of first thoracic vertebra, subsequent encounter for fracture with routine healing: Secondary | ICD-10-CM

## 2019-07-20 DIAGNOSIS — S069X0D Unspecified intracranial injury without loss of consciousness, subsequent encounter: Secondary | ICD-10-CM

## 2019-07-20 DIAGNOSIS — S81011D Laceration without foreign body, right knee, subsequent encounter: Secondary | ICD-10-CM

## 2019-07-20 DIAGNOSIS — S14125D Central cord syndrome at C5 level of cervical spinal cord, subsequent encounter: Secondary | ICD-10-CM | POA: Diagnosis not present

## 2019-07-20 DIAGNOSIS — M1711 Unilateral primary osteoarthritis, right knee: Secondary | ICD-10-CM

## 2019-07-20 DIAGNOSIS — M5432 Sciatica, left side: Secondary | ICD-10-CM

## 2019-07-20 DIAGNOSIS — S130XXD Traumatic rupture of cervical intervertebral disc, subsequent encounter: Secondary | ICD-10-CM | POA: Diagnosis not present

## 2019-07-20 DIAGNOSIS — Z981 Arthrodesis status: Secondary | ICD-10-CM

## 2019-07-20 DIAGNOSIS — G8929 Other chronic pain: Secondary | ICD-10-CM

## 2019-07-20 DIAGNOSIS — S71012D Laceration without foreign body, left hip, subsequent encounter: Secondary | ICD-10-CM

## 2019-07-20 DIAGNOSIS — Q068 Other specified congenital malformations of spinal cord: Secondary | ICD-10-CM

## 2019-07-20 DIAGNOSIS — F17210 Nicotine dependence, cigarettes, uncomplicated: Secondary | ICD-10-CM

## 2019-07-20 NOTE — Telephone Encounter (Signed)
Timmothy Sours, OT from [facility] called requesting HHOT 1wk1 to complete goals. Orders approved and given.

## 2019-07-21 ENCOUNTER — Telehealth: Payer: Self-pay | Admitting: Registered Nurse

## 2019-07-21 NOTE — Telephone Encounter (Signed)
Placed a call to Amy Mullins,she repeated her vascular study on 07/19/2019. There is no evidence of DVT in the lower extremity. Placed a call to Amy Mullins regarding the above. She verbalizes understanding.

## 2019-07-26 ENCOUNTER — Encounter: Payer: Medicare Other | Admitting: Physical Medicine and Rehabilitation

## 2019-07-28 DIAGNOSIS — S12690D Other displaced fracture of seventh cervical vertebra, subsequent encounter for fracture with routine healing: Secondary | ICD-10-CM | POA: Diagnosis not present

## 2019-07-28 DIAGNOSIS — Q068 Other specified congenital malformations of spinal cord: Secondary | ICD-10-CM

## 2019-07-28 DIAGNOSIS — S71012D Laceration without foreign body, left hip, subsequent encounter: Secondary | ICD-10-CM

## 2019-07-28 DIAGNOSIS — S14125D Central cord syndrome at C5 level of cervical spinal cord, subsequent encounter: Secondary | ICD-10-CM | POA: Diagnosis not present

## 2019-07-28 DIAGNOSIS — Z9181 History of falling: Secondary | ICD-10-CM

## 2019-07-28 DIAGNOSIS — S069X0D Unspecified intracranial injury without loss of consciousness, subsequent encounter: Secondary | ICD-10-CM

## 2019-07-28 DIAGNOSIS — G8929 Other chronic pain: Secondary | ICD-10-CM

## 2019-07-28 DIAGNOSIS — S22028D Other fracture of second thoracic vertebra, subsequent encounter for fracture with routine healing: Secondary | ICD-10-CM

## 2019-07-28 DIAGNOSIS — S12590D Other displaced fracture of sixth cervical vertebra, subsequent encounter for fracture with routine healing: Secondary | ICD-10-CM | POA: Diagnosis not present

## 2019-07-28 DIAGNOSIS — Z981 Arthrodesis status: Secondary | ICD-10-CM

## 2019-07-28 DIAGNOSIS — S22018D Other fracture of first thoracic vertebra, subsequent encounter for fracture with routine healing: Secondary | ICD-10-CM

## 2019-07-28 DIAGNOSIS — M5432 Sciatica, left side: Secondary | ICD-10-CM

## 2019-07-28 DIAGNOSIS — M1711 Unilateral primary osteoarthritis, right knee: Secondary | ICD-10-CM

## 2019-07-28 DIAGNOSIS — F17210 Nicotine dependence, cigarettes, uncomplicated: Secondary | ICD-10-CM

## 2019-07-28 DIAGNOSIS — S130XXD Traumatic rupture of cervical intervertebral disc, subsequent encounter: Secondary | ICD-10-CM | POA: Diagnosis not present

## 2019-07-28 DIAGNOSIS — S81011D Laceration without foreign body, right knee, subsequent encounter: Secondary | ICD-10-CM

## 2019-08-01 ENCOUNTER — Telehealth: Payer: Self-pay | Admitting: Gastroenterology

## 2019-08-01 NOTE — Telephone Encounter (Signed)
Please refer to message sent by this RN.

## 2019-08-01 NOTE — Telephone Encounter (Signed)
Understood.   Please do not put any other patient in that slot yet, as there may be clinic patients soon who will need it. Copied TT on this as well.

## 2019-08-01 NOTE — Telephone Encounter (Signed)
FYI- Spoke to the patient who reports she has no transportation at the moment. Attempted to convince the patient to keep her appt since she had a few weeks to find alternative transportation to the procedure. Patient unwilling to keep appt and aware that it may be another few months before she would be rescheduled. Pre-visit and procedure cancelled. Patient placed back on waiting list.

## 2019-08-04 ENCOUNTER — Encounter: Payer: Self-pay | Admitting: Physical Medicine and Rehabilitation

## 2019-08-04 ENCOUNTER — Encounter: Payer: Medicare Other | Attending: Registered Nurse | Admitting: Physical Medicine and Rehabilitation

## 2019-08-04 ENCOUNTER — Other Ambulatory Visit: Payer: Self-pay

## 2019-08-04 VITALS — BP 109/74 | HR 80 | Temp 98.7°F | Ht 67.0 in | Wt 341.0 lb

## 2019-08-04 DIAGNOSIS — S14109S Unspecified injury at unspecified level of cervical spinal cord, sequela: Secondary | ICD-10-CM | POA: Diagnosis present

## 2019-08-04 DIAGNOSIS — M4712 Other spondylosis with myelopathy, cervical region: Secondary | ICD-10-CM | POA: Diagnosis not present

## 2019-08-04 DIAGNOSIS — M5442 Lumbago with sciatica, left side: Secondary | ICD-10-CM | POA: Insufficient documentation

## 2019-08-04 DIAGNOSIS — M4800 Spinal stenosis, site unspecified: Secondary | ICD-10-CM | POA: Diagnosis not present

## 2019-08-04 DIAGNOSIS — M5432 Sciatica, left side: Secondary | ICD-10-CM | POA: Diagnosis not present

## 2019-08-04 DIAGNOSIS — G8929 Other chronic pain: Secondary | ICD-10-CM | POA: Diagnosis present

## 2019-08-04 DIAGNOSIS — I82441 Acute embolism and thrombosis of right tibial vein: Secondary | ICD-10-CM | POA: Insufficient documentation

## 2019-08-04 MED ORDER — LIDOCAINE 5 % EX OINT: 1.0000 "application " | TOPICAL_OINTMENT | Freq: Four times a day (QID) | CUTANEOUS | 5 refills | Status: DC | PRN

## 2019-08-04 MED ORDER — DULOXETINE HCL 30 MG PO CPEP: 30.0000 mg | ORAL_CAPSULE | Freq: Every day | ORAL | 5 refills | Status: DC
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
## 2019-08-04 NOTE — Patient Instructions (Signed)
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   1. Decrease Gabapentin to 1 pill 3x/day x week then 1 pill 2x/day for 1 week, then 1 pill nightly x 1 week then stop. Can start tapering the dose now, or wait until for 2 weeks until on normal dose of Duloxetine. -stopped it completely.  2. Duloxetine 30 mg daily x 1 week then 60 mg daily- for nerve pain. Most common side effects- mild constipation and dry eyes. Nausea in 1% of patients x 7 days- try to get through it if possible. Can call Dr Dagoberto Ligas for anti-nausea medicine if needed.  3. Try tennis balls- sit on tennis ball x 5-15 minutes daily x 1 month. After that, can use as needed. Keep 1 in house and 1 in car- keep 1 just in case.  4.  Lidocaine cream 5% 4x/day as needed- for L hand- to rub onto L hand up to 4x/day.  5. F/U in 6-8 weeks.

## 2019-08-18 ENCOUNTER — Telehealth: Payer: Self-pay | Admitting: Obstetrics & Gynecology

## 2019-08-18 NOTE — Telephone Encounter (Signed)
[facility] to schedule the patient an appointment. I informed the assistant of a referral however she stated the patient does not need one because she is already an established patient. Informed of the the appointment date and time. She stated she would reach out to the patient regarding the appointment. Also sending an appointment reminder letter.

## 2019-08-19 ENCOUNTER — Other Ambulatory Visit (HOSPITAL_COMMUNITY): Payer: Medicare Other

## 2019-08-23 ENCOUNTER — Ambulatory Visit (HOSPITAL_COMMUNITY): Admit: 2019-08-23 | Payer: Medicare Other | Admitting: Gastroenterology

## 2019-08-23 ENCOUNTER — Encounter (HOSPITAL_COMMUNITY): Payer: Self-pay

## 2019-08-23 SURGERY — COLONOSCOPY WITH PROPOFOL
Anesthesia: Monitor Anesthesia Care

## 2019-09-15 ENCOUNTER — Encounter: Payer: Self-pay | Admitting: Physical Medicine and Rehabilitation

## 2019-09-15 ENCOUNTER — Other Ambulatory Visit: Payer: Self-pay

## 2019-09-15 ENCOUNTER — Encounter: Payer: Medicare Other | Attending: Registered Nurse | Admitting: Physical Medicine and Rehabilitation

## 2019-09-15 VITALS — BP 137/66 | HR 81 | Temp 97.2°F | Ht 67.0 in | Wt 338.0 lb

## 2019-09-15 DIAGNOSIS — M79645 Pain in left finger(s): Secondary | ICD-10-CM | POA: Diagnosis not present

## 2019-09-15 DIAGNOSIS — I82441 Acute embolism and thrombosis of right tibial vein: Secondary | ICD-10-CM | POA: Diagnosis present

## 2019-09-15 DIAGNOSIS — S14109S Unspecified injury at unspecified level of cervical spinal cord, sequela: Secondary | ICD-10-CM | POA: Insufficient documentation

## 2019-09-15 DIAGNOSIS — M5442 Lumbago with sciatica, left side: Secondary | ICD-10-CM | POA: Insufficient documentation

## 2019-09-15 DIAGNOSIS — G8929 Other chronic pain: Secondary | ICD-10-CM | POA: Diagnosis present

## 2019-09-15 MED ORDER — TRAZODONE HCL 50 MG PO TABS: 50.0000 mg | ORAL_TABLET | Freq: Every evening | ORAL | 5 refills | Status: DC | PRN
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
## 2019-09-15 NOTE — Patient Instructions (Signed)
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 Also L 3rd digit pain.  1. Xray L hand/L 3rd digit to look for fracture and DJD.  2. The technique/bodywork- is called ischemic compression- holding pressure on a tight muscle- 2-4 minutes on each muscle- showed you 3 muscles- scalenes, in the neck, and levator and upper trap in upper shoulder- can cause radiating pain down arms and up in neck- can also use a tool called a theracane. Get from [facility]- the easiest if want to get it. - so hold pressure 2-4 minutes on each spot DAILY- not more than that.  3. Patient going to think about injections and think about who wants to see for pain medicines.  4.  Suggest tylenol  Scheduled could help L finger pain, but cannot diagnose it without an xray.  5. F/U as needed- if wants to see me for injections, let staff know.

## 2019-09-15 NOTE — Addendum Note (Signed)
Addended by: Courtney Heys on: 09/15/2019 02:01 PM   Modules accepted: Orders

## 2019-09-20 ENCOUNTER — Telehealth: Payer: Self-pay | Admitting: Emergency Medicine

## 2019-09-20 NOTE — Telephone Encounter (Signed)
Pt called and left message on nurse voicemail line stating that she needed her appointment for 09/30/19 rescheduled.

## 2019-09-30 ENCOUNTER — Ambulatory Visit: Payer: Medicare Other | Admitting: Obstetrics & Gynecology

## 2019-10-26 ENCOUNTER — Other Ambulatory Visit (HOSPITAL_COMMUNITY)
Admission: RE | Admit: 2019-10-26 | Discharge: 2019-10-26 | Disposition: A | Discharge status: Home / Self Care | Payer: Medicare Other | Source: Ambulatory Visit | Attending: Obstetrics & Gynecology | Admitting: Obstetrics & Gynecology

## 2019-10-26 ENCOUNTER — Encounter: Payer: Self-pay | Admitting: Obstetrics & Gynecology

## 2019-10-26 ENCOUNTER — Ambulatory Visit (INDEPENDENT_AMBULATORY_CARE_PROVIDER_SITE_OTHER): Payer: Medicare Other | Admitting: Obstetrics & Gynecology

## 2019-10-26 ENCOUNTER — Other Ambulatory Visit: Payer: Self-pay

## 2019-10-26 ENCOUNTER — Encounter: Payer: Self-pay | Admitting: Family Medicine

## 2019-10-26 VITALS — BP 123/76 | HR 84 | Wt 339.1 lb

## 2019-10-26 DIAGNOSIS — Z124 Encounter for screening for malignant neoplasm of cervix: Secondary | ICD-10-CM

## 2019-10-26 DIAGNOSIS — Z1151 Encounter for screening for human papillomavirus (HPV): Secondary | ICD-10-CM | POA: Insufficient documentation

## 2019-10-26 DIAGNOSIS — N938 Other specified abnormal uterine and vaginal bleeding: Secondary | ICD-10-CM | POA: Insufficient documentation

## 2019-10-26 DIAGNOSIS — N950 Postmenopausal bleeding: Secondary | ICD-10-CM | POA: Insufficient documentation

## 2019-10-26 NOTE — Progress Notes (Signed)
   Subjective:    Patient ID: Amy Mullins, female    DOB: [birth year], 58 y.o.   MRN: [id number]  HPI 58 yo single P5 here with continued irregular bleeding at age 58. She had a virtual appt for this issue in 4/20 but did not get the u/s that was ordered. She missed her next appt in 5/20.   Review of Systems She reports that she has been abstinent since 59/20.    Objective:   Physical Exam Breathing, conversing normally She uses a walker Well nourished, well hydrated Black female, no apparent distress Abd- benign, morbidly obese  UPT negative, consent signed, time out done Cervix prepped with betadine and grasped with a single tooth tenaculum Uterus sounded to 12 cm Pipelle used for 2 passes with a large amount of tissue obtained. She tolerated the procedure well.      Assessment & Plan:  PMB- check FSH to prove that this is PMB versus DUB Gyn u/s ordered again Come back after u/s results available

## 2019-10-26 NOTE — Addendum Note (Signed)
Addended by: Michel Harrow on: 10/26/2019 04:33 PM   Modules accepted: Orders

## 2019-10-27 ENCOUNTER — Telehealth (INDEPENDENT_AMBULATORY_CARE_PROVIDER_SITE_OTHER): Payer: Medicare Other | Admitting: General Practice

## 2019-10-27 DIAGNOSIS — N950 Postmenopausal bleeding: Secondary | ICD-10-CM

## 2019-10-27 LAB — FOLLICLE STIMULATING HORMONE: FSH: 18.2 m[IU]/mL

## 2019-10-27 NOTE — Telephone Encounter (Signed)
Patient called and left message on nurse voicemail line stating she was told she needs an ultrasound scheduled.   Scheduled U/S 12/1 @ 3pm. Called & informed patient. Patient states she is unsure if she can make that appt because she has to coordinate with her daughter's schedule. Provided ultrasound department phone number for patient. Patient verbalized understanding & had no questions.

## 2019-10-28 LAB — SURGICAL PATHOLOGY

## 2019-10-28 LAB — CYTOLOGY - PAP
Comment: NEGATIVE
Diagnosis: NEGATIVE
High risk HPV: NEGATIVE

## 2019-11-08 ENCOUNTER — Other Ambulatory Visit (HOSPITAL_COMMUNITY): Payer: Medicare Other

## 2019-11-08 ENCOUNTER — Ambulatory Visit (HOSPITAL_COMMUNITY)
Admission: RE | Admit: 2019-11-08 | Discharge: 2019-11-08 | Disposition: A | Discharge status: Home / Self Care | Payer: Medicare Other | Source: Ambulatory Visit | Attending: Obstetrics & Gynecology | Admitting: Obstetrics & Gynecology

## 2019-11-08 ENCOUNTER — Encounter: Payer: Self-pay | Admitting: *Deleted

## 2019-11-08 ENCOUNTER — Other Ambulatory Visit: Payer: Self-pay

## 2019-11-08 DIAGNOSIS — N950 Postmenopausal bleeding: Secondary | ICD-10-CM | POA: Diagnosis not present

## 2019-11-16 ENCOUNTER — Telehealth: Payer: Self-pay | Admitting: General Practice

## 2019-11-16 NOTE — Telephone Encounter (Signed)
Patient called and left message on nurse voicemail line stating she had an ultrasound earlier this month and was seen on 11/18. She would like her results.   Called patient and informed her of results. Discussed follow up in our office with Dr Hulan Fray and offered virtual appt on 12/21 @ 255. Patient verbalized understanding to all & had no questions.

## 2019-11-28 ENCOUNTER — Other Ambulatory Visit: Payer: Self-pay

## 2019-11-28 ENCOUNTER — Encounter: Payer: Self-pay | Admitting: Obstetrics & Gynecology

## 2019-11-28 ENCOUNTER — Telehealth (INDEPENDENT_AMBULATORY_CARE_PROVIDER_SITE_OTHER): Payer: Medicare Other | Admitting: Obstetrics & Gynecology

## 2019-11-28 DIAGNOSIS — Z9851 Tubal ligation status: Secondary | ICD-10-CM | POA: Diagnosis not present

## 2019-11-28 DIAGNOSIS — N938 Other specified abnormal uterine and vaginal bleeding: Secondary | ICD-10-CM

## 2019-11-28 DIAGNOSIS — Z0000 Encounter for general adult medical examination without abnormal findings: Secondary | ICD-10-CM

## 2019-11-28 MED ORDER — MEDROXYPROGESTERONE ACETATE 10 MG PO TABS: 10.0000 mg | ORAL_TABLET | Freq: Every day | ORAL | 12 refills | Status: DC

## 2019-11-28 NOTE — Progress Notes (Addendum)
TELEHEALTH GYNECOLOGY VIRTUAL VIDEO VISIT ENCOUNTER NOTE  Provider location: [facility] [place]   I connected with Guy Sandifer on 11/28/19 at  2:55 PM EST by MyChart Video Encounter at home and verified that I am speaking with the correct person using two identifiers.   I discussed the limitations, risks, security and privacy concerns of performing an evaluation and management service virtually and the availability of in person appointments. I also discussed with the patient that there may be a patient responsible charge related to this service. The patient expressed understanding and agreed to proceed.   History:  Louie Meaders is a 58 y.o. No obstetric history on file. female being evaluated today for DUB. She has had a BTL. She is now sexually active.     Past Medical History:  Diagnosis Date  . Abnormal vaginal bleeding   . Chronic back pain   . Obesity   . Posterior vitreous detachment of right eye   . Right knee DJD   . Sciatica   . Sciatica of left side    Past Surgical History:  Procedure Laterality Date  . ANTERIOR CERVICAL DECOMP/DISCECTOMY FUSION N/A 06/04/2019   Procedure: ANTERIOR CERVICAL DECOMPRESSION/DISCECTOMY FUSION 1 LEVEL;  Surgeon: Jadene Pierini, MD;  Location: [facility];  Service: Neurosurgery;  Laterality: N/A;  anterior    The following portions of the patient's history were reviewed and updated as appropriate: allergies, current medications, past family history, past medical history, past social history, past surgical history and problem list.     Review of Systems:  Pertinent items noted in HPI and remainder of comprehensive ROS otherwise negative. Her mom was in her 41s when she went through menopause.  Physical Exam:   General:  Alert, oriented and cooperative. Patient appears to be in no acute distress.  Mental Status: Normal mood and affect. Normal behavior. Normal judgment and thought content.   Respiratory: Normal  respiratory effort, no problems with respiration noted  Rest of physical exam deferred due to type of encounter  Labs and Imaging No results found for this or any previous visit (from the past 336 hour(s)). US PELVIC COMPLETE WITH TRANSVAGINAL  Result Date: 11/08/2019 CLINICAL DATA:  Postmenopausal bleeding EXAM: TRANSABDOMINAL AND TRANSVAGINAL ULTRASOUND OF PELVIS TECHNIQUE: Both transabdominal and transvaginal ultrasound examinations of the pelvis were performed. Transabdominal technique was performed for global imaging of the pelvis including uterus, ovaries, adnexal regions, and pelvic cul-de-sac. It was necessary to proceed with endovaginal exam following the transabdominal exam to visualize the endometrium and ovaries. COMPARISON:  04/07/2019 FINDINGS: Uterus Measurements: 6.8 x 6.9 x 8.8 cm = volume: 218 mL. Anteverted and mildly retroflexed. Small intramural leiomyoma at LEFT upper uterus 2.0 x 1.7 x 1.3 cm. Additional small isoechoic leiomyoma intramural at the LEFT anterior mid uterus, 2.1 x 2.0 x 1.6 cm. Endometrium Thickness: 18 mm. Heterogeneous in appearance, containing small cystic areas and small amount of nonspecific endometrial fluid Right ovary Measurements: 2.7 x 1.4 x 1.9 cm = volume: 3.5 mL. Normal morphology without mass Left ovary Measurements: 2.5 x 1.4 x 1.3 cm = volume: 2.3 mL. Normal morphology without mass Other findings Cystic collection LEFT adnexa likely hydrosalpinx 13 x 7 x 7 mm. No additional pelvic masses or RIGHT hydrosalpinx. IMPRESSION: Two small intramural leiomyomata of the upper and mid uterus. Short segment of LEFT hydrosalpinx again identified, no longer seen on RIGHT. Thickened heterogeneous endometrial complex 18 mm thick. In the setting of post-menopausal bleeding, endometrial sampling is  indicated to exclude carcinoma. If results are benign, sonohysterogram should be considered for focal lesion work-up. (Ref: Radiological Reasoning: Algorithmic Workup of  Abnormal Vaginal Bleeding with Endovaginal Sonography and Sonohysterography. AJR 2008; 226:J33-54) Electronically Signed   By: Lavonia Dana M.D.   On: 11/08/2019 15:14       Assessment and Plan:  DUB- Her FSH was in the PRE menopausal. I have prescribed cyclic provera to regulate her periods. Her uterus is very small, probably too small for Minerva If Provera doesn't work, consider IUD      I discussed the assessment and treatment plan with the patient. The patient was provided an opportunity to ask questions and all were answered. The patient agreed with the plan and demonstrated an understanding of the instructions.   The patient was advised to call back or seek an in-person evaluation/go to the ED if the symptoms worsen or if the condition fails to improve as anticipated.  I provided 10 minutes of face-to-face time during this encounter.   Emily Filbert, MD [facility], [place]

## 2019-11-28 NOTE — Progress Notes (Signed)
I connected with  Gennette Pac on 11/28/19 at  2:55 PM EST by telephone and verified that I am speaking with the correct person using two identifiers.   I discussed the limitations, risks, security and privacy concerns of performing an evaluation and management service by telephone and the availability of in person appointments. I also discussed with the patient that there may be a patient responsible charge related to this service. The patient expressed understanding and agreed to proceed.   Annabell Howells, RN 11/28/2019  3:06 PM

## 2019-12-15 ENCOUNTER — Encounter: Payer: Self-pay | Admitting: *Deleted

## 2019-12-15 ENCOUNTER — Telehealth: Payer: Self-pay | Admitting: *Deleted

## 2019-12-15 NOTE — Telephone Encounter (Signed)
Left message for the patient to call back to schedule colonoscopy.   Letter mailed to patient due to several attempts since her cancellation to schedule the patient for her colonoscopy.

## 2020-02-16 ENCOUNTER — Other Ambulatory Visit: Payer: Self-pay | Admitting: Endocrinology

## 2020-02-16 DIAGNOSIS — Z1231 Encounter for screening mammogram for malignant neoplasm of breast: Secondary | ICD-10-CM

## 2020-03-09 ENCOUNTER — Ambulatory Visit
Admission: RE | Admit: 2020-03-09 | Discharge: 2020-03-09 | Disposition: A | Discharge status: Home / Self Care | Payer: Medicare Other | Source: Ambulatory Visit | Attending: Endocrinology | Admitting: Endocrinology

## 2020-03-09 ENCOUNTER — Other Ambulatory Visit: Payer: Self-pay

## 2020-03-09 DIAGNOSIS — Z1231 Encounter for screening mammogram for malignant neoplasm of breast: Secondary | ICD-10-CM
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
## 2020-07-16 IMAGING — RF CERVICAL SPINE - 2-3 VIEW
1 series · 4 of 4 positions shown · non-contrast
Comparison: MR cervical spine 06/03/2019

FLUOROSCOPY TIME:  0 minutes 32 seconds

Images submitted: 4

CLINICAL DATA: Elective surgery

EXAM:
DG C-ARM 61-120 MIN; CERVICAL SPINE - 2-3 VIEW

[Series 1: run · 4 of 4 slices shown]
[im 1/4]
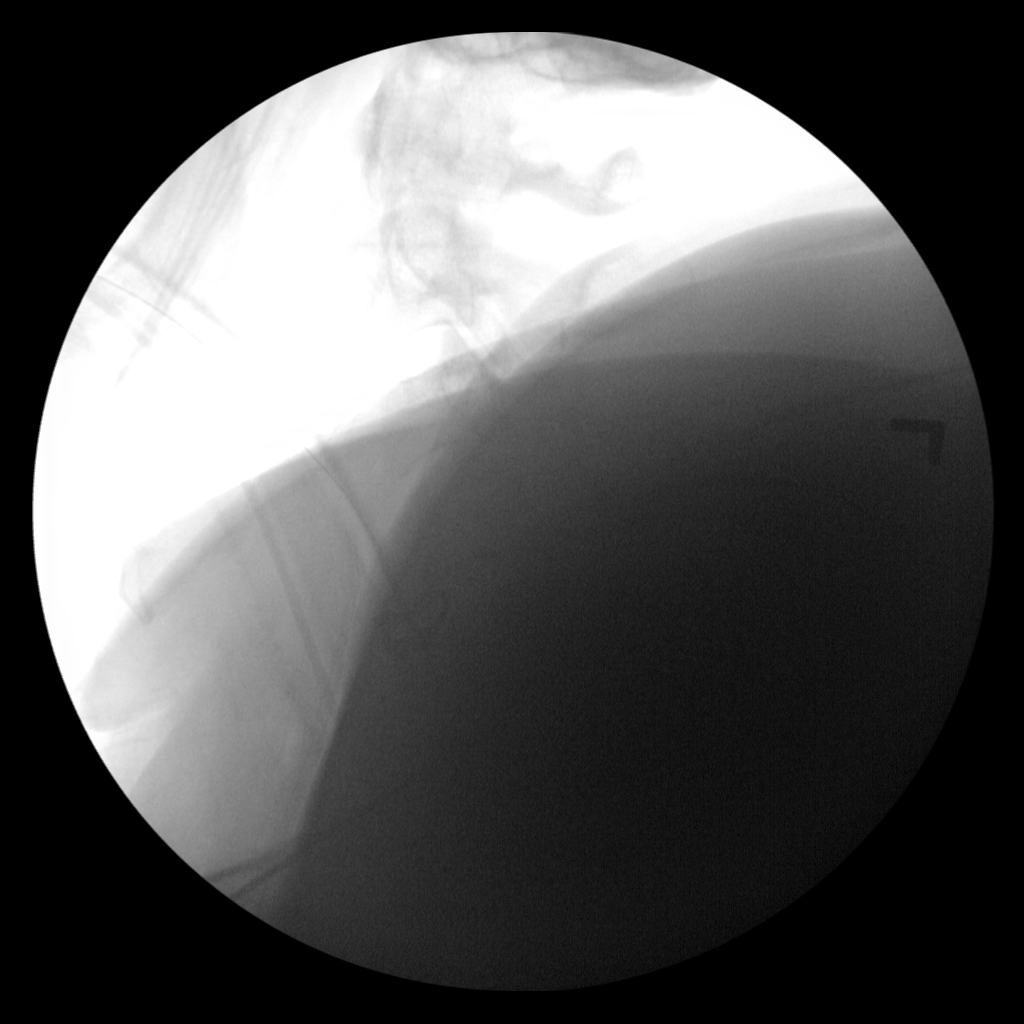
[im 2/4]
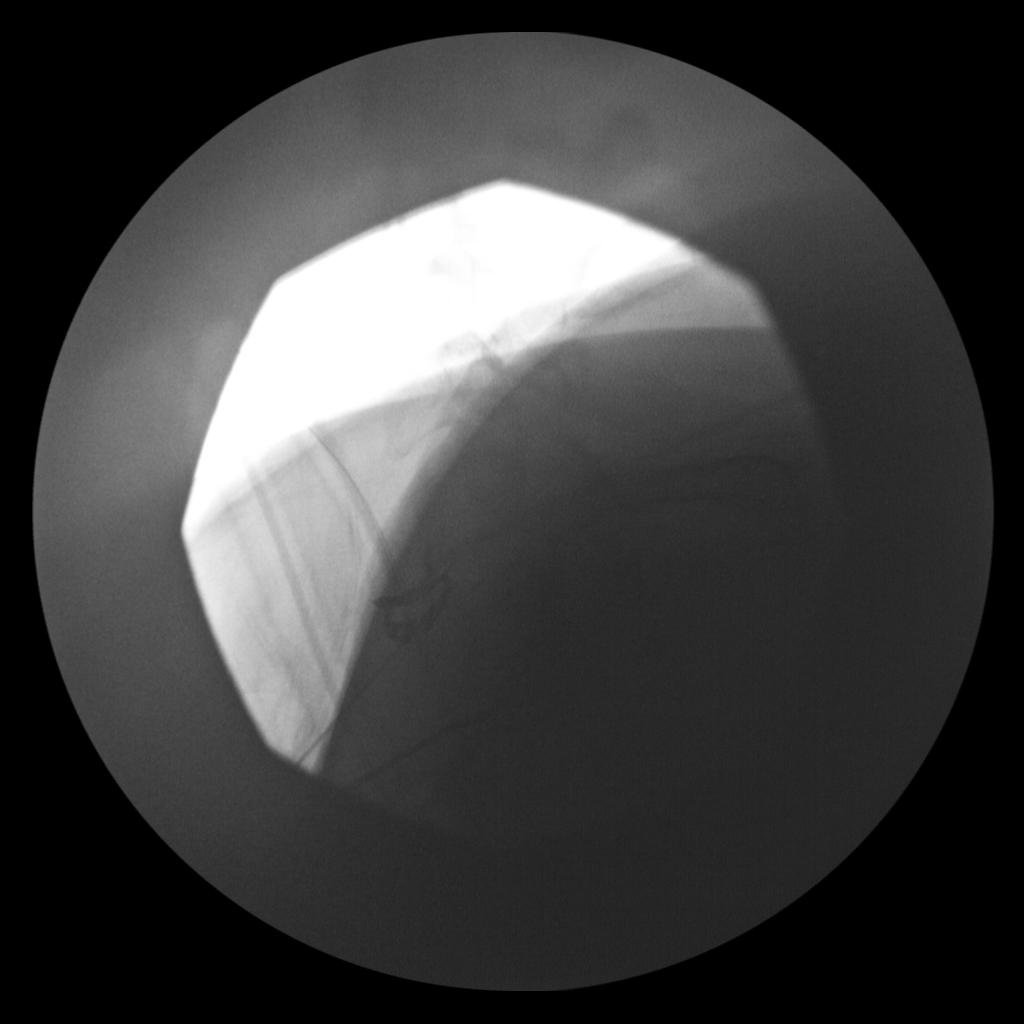
[im 3/4]
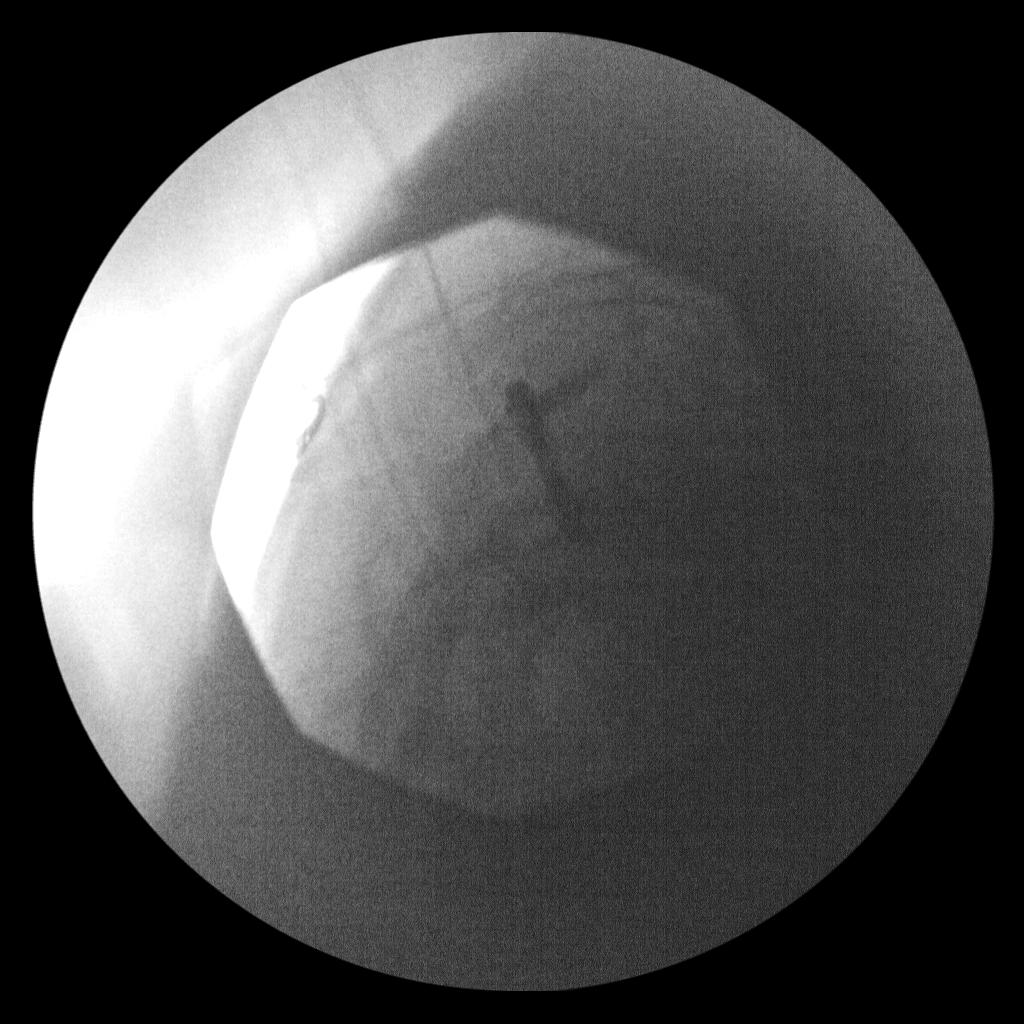
[im 4/4]
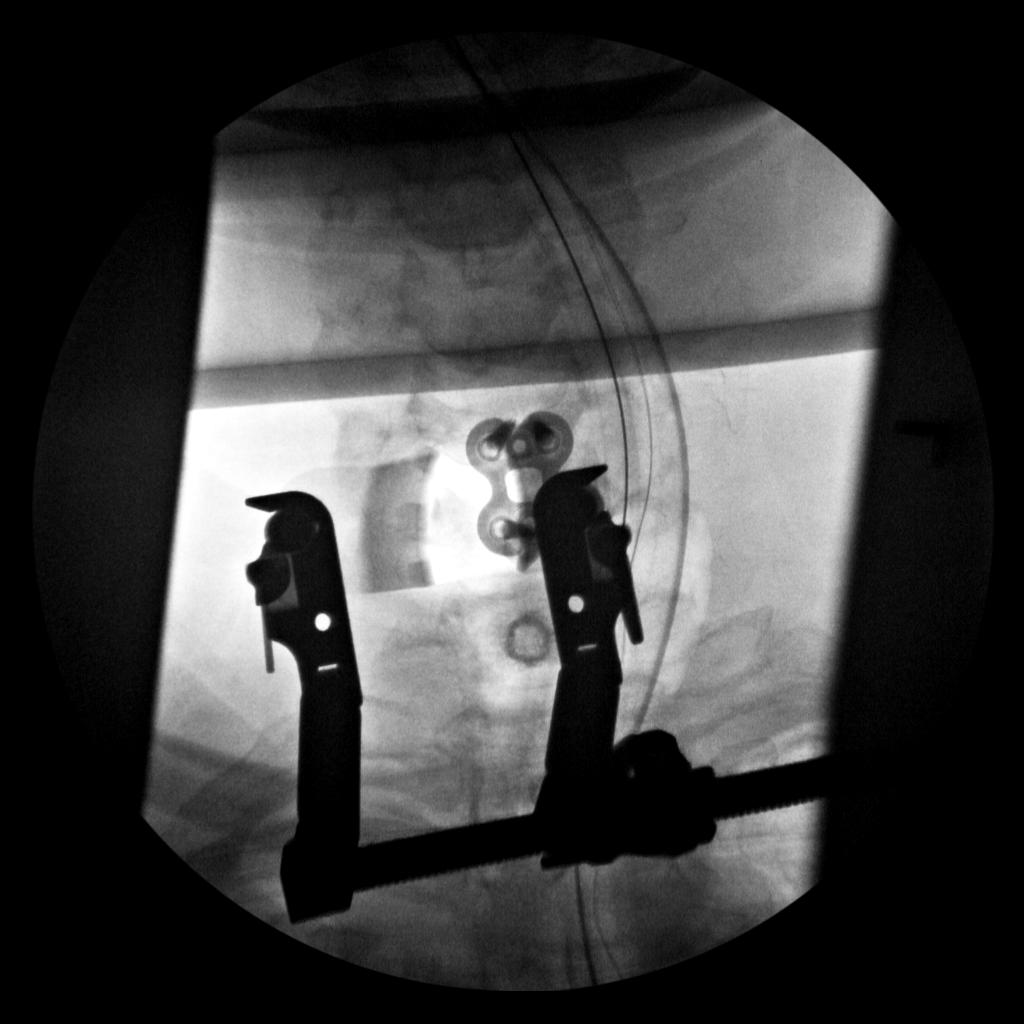

[4 of 4 positions shown; findings below may reference images not displayed]

FINDINGS: Four digital C-arm fluoroscopic images were submitted.

Three lateral in view images are overpenetrated and unable to assign
cervical levels or adequately assess anatomy.

Single AP view demonstrates a plate and screws at the mid to
inferior cervical spine, levels difficult to assess, questionably
C5-C6.
IMPRESSION: Inadequate visualization of the cervical spine on submitted
intraoperative images.

Surgical plate and screws are identified, question at C5-C6.

## 2020-11-09 ENCOUNTER — Other Ambulatory Visit: Payer: Self-pay | Admitting: Physical Medicine and Rehabilitation

## 2020-11-09 DIAGNOSIS — M79645 Pain in left finger(s): Secondary | ICD-10-CM

## 2021-04-23 ENCOUNTER — Ambulatory Visit (HOSPITAL_COMMUNITY)
Admission: EM | Admit: 2021-04-23 | Discharge: 2021-04-23 | Disposition: A | Discharge status: Home / Self Care | Payer: Medicare Other | Attending: Internal Medicine | Admitting: Internal Medicine

## 2021-04-23 ENCOUNTER — Other Ambulatory Visit: Payer: Self-pay

## 2021-04-23 DIAGNOSIS — Z1152 Encounter for screening for COVID-19: Secondary | ICD-10-CM

## 2021-04-23 NOTE — ED Triage Notes (Signed)
Pt here requesting Covid testing only for travel purposes; denies symptoms.

## 2021-04-24 LAB — SARS CORONAVIRUS 2 (TAT 6-24 HRS): SARS Coronavirus 2: NEGATIVE

## 2021-05-04 ENCOUNTER — Ambulatory Visit (HOSPITAL_COMMUNITY)
Admission: EM | Admit: 2021-05-04 | Discharge: 2021-05-04 | Disposition: A | Discharge status: Home / Self Care | Payer: Medicare Other | Attending: Internal Medicine | Admitting: Internal Medicine

## 2021-05-04 DIAGNOSIS — Z20822 Contact with and (suspected) exposure to covid-19: Secondary | ICD-10-CM | POA: Insufficient documentation

## 2021-05-04 DIAGNOSIS — Z0189 Encounter for other specified special examinations: Secondary | ICD-10-CM | POA: Insufficient documentation

## 2021-05-04 LAB — SARS CORONAVIRUS 2 (TAT 6-24 HRS): SARS Coronavirus 2: NEGATIVE

## 2021-05-04 NOTE — Discharge Instructions (Signed)

## 2021-05-04 NOTE — ED Triage Notes (Signed)
requesting covid test for traveling

## 2022-01-28 ENCOUNTER — Other Ambulatory Visit: Payer: Self-pay | Admitting: Endocrinology

## 2022-01-28 DIAGNOSIS — Z1231 Encounter for screening mammogram for malignant neoplasm of breast: Secondary | ICD-10-CM

## 2022-06-06 ENCOUNTER — Ambulatory Visit: Payer: Medicare Other

## 2022-06-06 ENCOUNTER — Ambulatory Visit
Admission: RE | Admit: 2022-06-06 | Discharge: 2022-06-06 | Disposition: A | Discharge status: Home / Self Care | Payer: Medicare HMO | Source: Ambulatory Visit | Attending: Endocrinology | Admitting: Endocrinology

## 2022-06-06 DIAGNOSIS — Z1231 Encounter for screening mammogram for malignant neoplasm of breast: Secondary | ICD-10-CM

## 2022-06-24 ENCOUNTER — Emergency Department (HOSPITAL_COMMUNITY)
Admission: EM | Admit: 2022-06-24 | Discharge: 2022-06-25 | Discharge status: Against Medical Advice | Payer: Medicare HMO | Attending: Emergency Medicine | Admitting: Emergency Medicine

## 2022-06-24 DIAGNOSIS — R42 Dizziness and giddiness: Secondary | ICD-10-CM | POA: Diagnosis not present

## 2022-06-24 DIAGNOSIS — Z5321 Procedure and treatment not carried out due to patient leaving prior to being seen by health care provider: Secondary | ICD-10-CM | POA: Diagnosis not present

## 2022-06-24 DIAGNOSIS — M79641 Pain in right hand: Secondary | ICD-10-CM | POA: Diagnosis not present

## 2022-06-24 DIAGNOSIS — M25512 Pain in left shoulder: Secondary | ICD-10-CM | POA: Insufficient documentation

## 2022-06-24 NOTE — ED Provider Triage Note (Signed)
Emergency Medicine Provider Triage Evaluation Note  Amy Mullins , a 61 y.o. female  was evaluated in triage.  Pt complains of LUE pain that began earlier tonight around 9PM. Patient reports pain to the RUE from the shoulder down to her wrist, she initially told triage numbness however states it just really hurts and is not having sensation changes other than pain. Worse with movement, no alleviating factors. No other areas of pain, denies numbness, weakness, change in vision, dizziness like the room spinning, or chest pain. States she feels lightheaded at times and stumbles when not using her walker which she thinks is due to anemia.    Review of Systems  Per above.   Physical Exam  BP 133/81 (BP Location: Right Arm)   Pulse 90   Temp 98.5 F (36.9 C) (Oral)   Resp 18   LMP 06/20/2019   SpO2 98%  Gen:   Awake, no distress   Resp:  Normal effort  MSK:   No midline spinal tenderness. Left trapezius and cervical paraspinal muscle TTP. Upper extremities w/ intact AROM, pain with full left shoulder flexion. Compartments are soft.  Neuro:   Alert. Clear speech.  CN III through XII grossly intact.  Sensation intact to light touch and feels symmetric per patient to the bilateral upper and lower extremities.  5 out of 5 symmetric grip strength.  Ambulatory.  Medical Decision Making  Medically screening exam initiated at 11:49 PM.  Appropriate orders placed.  Amy Mullins was informed that the remainder of the evaluation will be completed by another provider, this initial triage assessment does not replace that evaluation, and the importance of remaining in the ED until their evaluation is complete.  Left arm pain. No focal neuro deficits.   Discussed w/ attending.    Cherly Anderson, [place] 06/25/22 [phone]

## 2022-06-24 NOTE — ED Triage Notes (Signed)
Pt c/o sudden onset numbness, pain in L shoulder into arm this evening, progressively worse. States she feels gait is abnormal, endorses dizziness "like I do a two-step." Denies N/V, CP, SHOB. No unilateral deficit noted in triage

## 2022-06-25 LAB — COMPREHENSIVE METABOLIC PANEL
ALT: 14 U/L (ref 0–44)
AST: 15 U/L (ref 15–41)
Albumin: 3.3 g/dL — ABNORMAL LOW (ref 3.5–5.0)
Alkaline Phosphatase: 70 U/L (ref 38–126)
Anion gap: 9 (ref 5–15)
BUN: 7 mg/dL — ABNORMAL LOW (ref 8–23)
CO2: 22 mmol/L (ref 22–32)
Calcium: 9 mg/dL (ref 8.9–10.3)
Chloride: 105 mmol/L (ref 98–111)
Creatinine, Ser: 0.76 mg/dL (ref 0.44–1.00)
GFR, Estimated: >60 mL/min (ref >60)
Glucose, Bld: 129 mg/dL — ABNORMAL HIGH (ref 70–99)
Potassium: 3.7 mmol/L (ref 3.5–5.1)
Sodium: 136 mmol/L (ref 135–145)
Total Bilirubin: 0.5 mg/dL (ref 0.3–1.2)
Total Protein: 6.9 g/dL (ref 6.5–8.1)

## 2022-06-25 LAB — CBC WITH DIFFERENTIAL/PLATELET
Abs Immature Granulocytes: 0.04 10*3/uL (ref 0.00–0.07)
Basophils Absolute: 0.1 10*3/uL (ref 0.0–0.1)
Basophils Relative: 1 %
Eosinophils Absolute: 0.2 10*3/uL (ref 0.0–0.5)
Eosinophils Relative: 2 %
HCT: 26.1 % — ABNORMAL LOW (ref 36.0–46.0)
Hemoglobin: 7.4 g/dL — ABNORMAL LOW (ref 12.0–15.0)
Immature Granulocytes: 0 %
Lymphocytes Relative: 27 %
Lymphs Abs: 2.5 10*3/uL (ref 0.7–4.0)
MCH: 18.1 pg — ABNORMAL LOW (ref 26.0–34.0)
MCHC: 28.4 g/dL — ABNORMAL LOW (ref 30.0–36.0)
MCV: 64 fL — ABNORMAL LOW (ref 80.0–100.0)
Monocytes Absolute: 0.6 10*3/uL (ref 0.1–1.0)
Monocytes Relative: 7 %
Neutro Abs: 5.7 10*3/uL (ref 1.7–7.7)
Neutrophils Relative %: 63 %
Platelets: 432 10*3/uL — ABNORMAL HIGH (ref 150–400)
RBC: 4.08 MIL/uL (ref 3.87–5.11)
RDW: 20.5 % — ABNORMAL HIGH (ref 11.5–15.5)
WBC: 9 10*3/uL (ref 4.0–10.5)
nRBC: 0 % (ref 0.0–0.2)

## 2022-06-25 NOTE — ED Notes (Signed)
Patient did not respond for vitals 

## 2022-06-25 NOTE — ED Notes (Signed)
Patient did not respond to room call

## 2022-09-28 ENCOUNTER — Encounter (HOSPITAL_COMMUNITY): Payer: Self-pay

## 2022-09-28 ENCOUNTER — Other Ambulatory Visit: Payer: Self-pay

## 2022-09-28 ENCOUNTER — Emergency Department (HOSPITAL_COMMUNITY)
Admission: EM | Admit: 2022-09-28 | Discharge: 2022-09-28 | Disposition: A | Discharge status: Home / Self Care | Payer: Medicare HMO | Attending: Emergency Medicine | Admitting: Emergency Medicine

## 2022-09-28 DIAGNOSIS — N939 Abnormal uterine and vaginal bleeding, unspecified: Secondary | ICD-10-CM | POA: Insufficient documentation

## 2022-09-28 DIAGNOSIS — R5383 Other fatigue: Secondary | ICD-10-CM | POA: Diagnosis present

## 2022-09-28 DIAGNOSIS — D500 Iron deficiency anemia secondary to blood loss (chronic): Secondary | ICD-10-CM | POA: Diagnosis not present

## 2022-09-28 LAB — TYPE AND SCREEN
ABO/RH(D): O POS
Antibody Screen: NEGATIVE

## 2022-09-28 LAB — CBC WITH DIFFERENTIAL/PLATELET
Abs Immature Granulocytes: 0.03 10*3/uL (ref 0.00–0.07)
Basophils Absolute: 0 10*3/uL (ref 0.0–0.1)
Basophils Relative: 0 %
Eosinophils Absolute: 0.1 10*3/uL (ref 0.0–0.5)
Eosinophils Relative: 1 %
HCT: 24.6 % — ABNORMAL LOW (ref 36.0–46.0)
Hemoglobin: 7.1 g/dL — ABNORMAL LOW (ref 12.0–15.0)
Immature Granulocytes: 0 %
Lymphocytes Relative: 30 %
Lymphs Abs: 2.1 10*3/uL (ref 0.7–4.0)
MCH: 18.1 pg — ABNORMAL LOW (ref 26.0–34.0)
MCHC: 28.9 g/dL — ABNORMAL LOW (ref 30.0–36.0)
MCV: 62.6 fL — ABNORMAL LOW (ref 80.0–100.0)
Monocytes Absolute: 0.4 10*3/uL (ref 0.1–1.0)
Monocytes Relative: 6 %
Neutro Abs: 4.5 10*3/uL (ref 1.7–7.7)
Neutrophils Relative %: 63 %
Platelets: 479 10*3/uL — ABNORMAL HIGH (ref 150–400)
RBC: 3.93 MIL/uL (ref 3.87–5.11)
RDW: 20.6 % — ABNORMAL HIGH (ref 11.5–15.5)
WBC: 7.2 10*3/uL (ref 4.0–10.5)
nRBC: 0 % (ref 0.0–0.2)

## 2022-09-28 LAB — COMPREHENSIVE METABOLIC PANEL
ALT: 14 U/L (ref 0–44)
AST: 18 U/L (ref 15–41)
Albumin: 3.4 g/dL — ABNORMAL LOW (ref 3.5–5.0)
Alkaline Phosphatase: 52 U/L (ref 38–126)
Anion gap: 9 (ref 5–15)
BUN: 6 mg/dL — ABNORMAL LOW (ref 8–23)
CO2: 24 mmol/L (ref 22–32)
Calcium: 9.2 mg/dL (ref 8.9–10.3)
Chloride: 102 mmol/L (ref 98–111)
Creatinine, Ser: 0.72 mg/dL (ref 0.44–1.00)
GFR, Estimated: >60 mL/min (ref >60)
Glucose, Bld: 106 mg/dL — ABNORMAL HIGH (ref 70–99)
Potassium: 3.6 mmol/L (ref 3.5–5.1)
Sodium: 135 mmol/L (ref 135–145)
Total Bilirubin: 0.6 mg/dL (ref 0.3–1.2)
Total Protein: 7.3 g/dL (ref 6.5–8.1)

## 2022-09-28 NOTE — ED Provider Triage Note (Signed)
Emergency Medicine Provider Triage Evaluation Note  Amy Mullins , a 61 y.o. female  was evaluated in triage.  Pt complains of anemia.  Patient states that she was sent here by her PCP for hemoglobin of 6.9.  Patient notes vaginal bleeding for the past "several months."  Bleeding has been intermittent in nature.  She states she has been bleeding constantly for the past month.  Reports some associated dizziness, lightheadedness with minimal physical exertion.  Denies anticoagulation/antiplatelet use..  Review of Systems  Positive: See above Negative:   Physical Exam  BP 133/80   Pulse 79   Temp 98.7 F (37.1 C) (Oral)   Resp 16   LMP 06/20/2019   SpO2 99%  Gen:   Awake, no distress   Resp:  Normal effort  MSK:   Moves extremities without difficulty  Other:    Medical Decision Making  Medically screening exam initiated at 2:06 PM.  Appropriate orders placed.  Manreet Kiernan was informed that the remainder of the evaluation will be completed by another provider, this initial triage assessment does not replace that evaluation, and the importance of remaining in the ED until their evaluation is complete.     Wilnette Kales, [place] 09/28/22 1407

## 2022-09-28 NOTE — ED Provider Notes (Signed)
[facility] EMERGENCY DEPARTMENT Provider Note   CSN: [id number] Arrival date & time: 09/28/22  1254     History  Chief Complaint  Patient presents with   low Hgb    Amy Mullins is a 61 y.o. female.  Patient is a very pleasant 61 year old female with a history of persistent vaginal bleeding and chronic pain who is presenting today to the emergency room because her pain doctor called her reporting her blood counts were very low and she needed to go to the emergency room immediately.  Patient reports she has been having abnormal vaginal bleeding for quite some time now.  In the last few months it has been more persistent.  She reports that she will bleed pretty constantly and have 3 to 4 days of no bleeding and then the bleeding will start again.  She sometimes passes a very large blood clots when she has bleeding and reports she feels like she never truly went through menopause as she has not had no period of time where she had a pleural long period of no menses.  She does not currently have an OB/GYN and reports she is only able to see her pain doctor because she can only see someone on the weekend due to her watching her grandchildren during the week.  For over 6 months now she has noticed fatigue, no energy, pica and sometimes feels lightheaded with standing up.  She has not had any chest pain or shortness of breath.  She reports her blood counts were checked by her pain doctor in the last month or so and they were in the 7 range.  She was also in the emergency department in July and had blood work done at that time and blood counts were 7.8.  Patient is taking iron twice daily and denies any anticoagulation use.  The history is provided by the patient.       Home Medications Prior to Admission medications   Medication Sig Start Date End Date Taking? Authorizing Provider  gabapentin (NEURONTIN) 300 MG capsule Take 300 mg by mouth 3 (three) times daily.    [provider]  HYDROcodone-acetaminophen (NORCO) 10-325 MG tablet Take 1 tablet by mouth every 6 (six) hours as needed (for pain).     [provider]  medroxyPROGESTERone (PROVERA) 10 MG tablet Take 1 tablet (10 mg total) by mouth daily. Use for ten days 11/28/19   Emily Filbert, MD  traZODone (DESYREL) 50 MG tablet Take 1 tablet (50 mg total) by mouth at bedtime as needed for sleep. Patient not taking: Reported on 11/28/2019 09/15/19   Courtney Heys, MD      Allergies    Patient has no known allergies.    Review of Systems   Review of Systems  Physical Exam Updated Vital Signs BP (!) 140/88   Pulse 77   Temp 99.1 F (37.3 C) (Oral)   Resp 17   LMP 06/20/2019   SpO2 100%  Physical Exam Vitals and nursing note reviewed.  Constitutional:      General: She is not in acute distress.    Appearance: She is well-developed.  HENT:     Head: Normocephalic and atraumatic.  Eyes:     Pupils: Pupils are equal, round, and reactive to light.     Comments: Pale conjunctive a and oral mucosa  Cardiovascular:     Rate and Rhythm: Normal rate and regular rhythm.     Heart sounds: Normal heart sounds.  No murmur heard.    No friction rub.  Pulmonary:     Effort: Pulmonary effort is normal.     Breath sounds: Normal breath sounds. No wheezing or rales.  Abdominal:     General: Bowel sounds are normal. There is no distension.     Palpations: Abdomen is soft.     Tenderness: There is no abdominal tenderness. There is no guarding or rebound.  Musculoskeletal:        General: No tenderness. Normal range of motion.     Comments: No edema  Skin:    General: Skin is warm and dry.     Coloration: Skin is not pale.     Findings: No rash.  Neurological:     Mental Status: She is alert and oriented to person, place, and time.     Cranial Nerves: No cranial nerve deficit.  Psychiatric:        Behavior: Behavior normal.     ED Results / Procedures / Treatments   Labs (all labs  ordered are listed, but only abnormal results are displayed) Labs Reviewed  COMPREHENSIVE METABOLIC PANEL - Abnormal; Notable for the following components:      Result Value   Glucose, Bld 106 (*)    BUN 6 (*)    Albumin 3.4 (*)    All other components within normal limits  CBC WITH DIFFERENTIAL/PLATELET - Abnormal; Notable for the following components:   Hemoglobin 7.1 (*)    HCT 24.6 (*)    MCV 62.6 (*)    MCH 18.1 (*)    MCHC 28.9 (*)    RDW 20.6 (*)    Platelets 479 (*)    All other components within normal limits  TYPE AND SCREEN  ABO/RH    EKG None  Radiology No results found.  Procedures Procedures    Medications Ordered in ED Medications - No data to display  ED Course/ Medical Decision Making/ A&P                           Medical Decision Making  Pt with multiple medical problems and comorbidities and presenting today with a complaint that caries a high risk for morbidity and mortality.  Here today for abnormal blood work and anemia.  Based on looking at prior records since July patient has had blood counts running in the 7 range.  She saw her chronic pain doctor and had blood work done Friday and hemoglobin came back at 6.7 and her doctor urged her to come to the emergency room today.  Patient does have symptoms of chronic anemia but has had no worsening of her symptoms recently.  She has had no syncope, shortness of breath or chest pain.  She is taking iron twice daily and continues to have vaginal bleeding.  Her doctor had given her medication to try to get the vaginal bleeding to stop which she had not picked up and started yet.  Patient's vital signs are otherwise stable.  She is in no acute distress at this time.  Had a long discussion with the patient about chronic anemia and indication for blood transfusion versus iron supplementation.  Patient is not showing signs of needing an emergent blood transfusion today.  I independently interpreted her labs and her  hemoglobin today is 7.1 which is not significantly different than what it was 4 months ago.  Or even 1 month ago based on what she reported her results were  with her doctor.  Patient is already taking iron and may require iron transfusion but do not feel that the risk of a blood transfusion is warranted today.  Discussed all of this with the patient.  She is comfortable with this plan.  Her daughter was also present for this discussion.  No social determinants affecting her discharge today.  Strongly urged patient to call the the women's clinic as she needs evaluation of ongoing vaginal bleeding now at the age of 71 and more definitive treatment.  She also has medication that was just prescribed for her to take to stop the bleeding.  She was not sure what it was called.  Gave patient return precautions but at this time there is no indication for admission or blood transfusion.         Final Clinical Impression(s) / ED Diagnoses Final diagnoses:  Iron deficiency anemia due to chronic blood loss  Abnormal vaginal bleeding    Rx / DC Orders ED Discharge Orders     None         Gwyneth Sprout, MD 09/28/22 1837

## 2022-09-28 NOTE — ED Notes (Signed)
Called pt  phone and she hung up. Before I could tell her who I was.

## 2022-09-28 NOTE — ED Triage Notes (Signed)
Patient sent from her MD for low Hgb. Reports that she has had vaginal bleeding x 1 month. Complains of fatigue and dizziness. Alert and oriented

## 2022-09-28 NOTE — ED Notes (Signed)
Pt is back. 

## 2022-09-28 NOTE — Discharge Instructions (Signed)
Continue to take your iron.  You will need follow up for repeat blood count and iron checks in the future.

## 2022-11-18 ENCOUNTER — Other Ambulatory Visit: Payer: Self-pay | Admitting: Obstetrics and Gynecology

## 2022-11-18 HISTORY — PX: ENDOMETRIAL BIOPSY: SHX622

## 2022-12-08 DIAGNOSIS — F17200 Nicotine dependence, unspecified, uncomplicated: Secondary | ICD-10-CM

## 2022-12-08 HISTORY — DX: Nicotine dependence, unspecified, uncomplicated: F17.200

## 2023-02-17 ENCOUNTER — Encounter (HOSPITAL_BASED_OUTPATIENT_CLINIC_OR_DEPARTMENT_OTHER): Payer: Self-pay | Admitting: Obstetrics and Gynecology

## 2023-02-23 ENCOUNTER — Other Ambulatory Visit: Payer: Self-pay

## 2023-02-23 ENCOUNTER — Encounter (HOSPITAL_BASED_OUTPATIENT_CLINIC_OR_DEPARTMENT_OTHER): Payer: Self-pay | Admitting: Obstetrics and Gynecology

## 2023-02-23 NOTE — Progress Notes (Addendum)
Spoke w/ via phone for pre-op interview---[facility] needs dos---- urine pregnancy              Lab results------02/24/23 lab appt for cbc, bmp, type & screen, hiv  06/24/22 EKG in chart & Epic COVID test -----patient states asymptomatic no test needed Arrive at -------1100 on Monday, 03/02/23 NPO after MN NO Solid Food.  Clear liquids from MN until---1000 Med rec completed Medications to take morning of surgery -----Lipitor Diabetic medication -----Hold Rybelsus for 24 hours prior to surgery. Hold Metformin on day of surgery. Patient instructed no nail polish to be worn day of surgery Patient instructed to bring photo id and insurance card day of surgery Patient aware to have Driver (ride ) / caregiver    for 24 hours after surgery - daughter Onalee Hua Patient Special Instructions -----Do your best not to smoke for 24 hours prior to surgery. Extended/ Overnight stay instructions given. Pre-Op special Istructions -----none Patient verbalized understanding of instructions that were given at this phone interview. Patient denies shortness of breath, chest pain, fever, cough at this phone interview.  Reviewed case with Dr. Deatra Canter on 02/23/23. Ok to proceed with surgery at [facility] per Dr. Ola Spurr.

## 2023-02-23 NOTE — Progress Notes (Signed)
Your procedure is scheduled on Monday, 03/02/23.  Report to [facility] AT  11:00 AM.   Call this number if you have problems the morning of surgery  :[phone].   OUR ADDRESS IS [place].  WE ARE LOCATED IN [facility].  PLEASE BRING YOUR INSURANCE CARD AND PHOTO ID DAY OF SURGERY.  ONLY 2 PEOPLE ARE ALLOWED IN  WAITING  ROOM / CURRENTLY NO ONE UNDER AGE 63                                     REMEMBER:  DO NOT EAT FOOD, CANDY GUM OR MINTS  AFTER MIDNIGHT THE NIGHT BEFORE YOUR SURGERY . YOU MAY HAVE CLEAR LIQUIDS FROM MIDNIGHT THE NIGHT BEFORE YOUR SURGERY UNTIL  10:00 AM. NO CLEAR LIQUIDS AFTER   10:00 AM  DAY OF SURGERY.  YOU MAY  BRUSH YOUR TEETH MORNING OF SURGERY AND RINSE YOUR MOUTH OUT, NO CHEWING GUM CANDY OR MINTS.     CLEAR LIQUID DIET    Allowed      Water                                                                   Coffee and tea, regular and decaf  (NO cream or milk products of any type, may sweeten)                         Carbonated beverages, regular and diet                                    Sports drinks like Gatorade _____________________________________________________________________     TAKE ONLY THESE MEDICATIONS MORNING OF SURGERY: Lipitor (atorvastatin) Hold Rybelsus for 24 hours prior to surgery. Do not take Metformin on the morning of surgery.    UP TO 4 VISITORS  MAY VISIT IN THE EXTENDED RECOVERY ROOM UNTIL 800 PM ONLY.  ONE  VISITOR AGE 20 AND OVER MAY SPEND THE NIGHT AND MUST BE IN EXTENDED RECOVERY ROOM NO LATER THAN 800 PM . YOUR DISCHARGE TIME AFTER YOU SPEND THE NIGHT IS 900 AM THE MORNING AFTER YOUR SURGERY.  YOU MAY PACK A SMALL OVERNIGHT BAG WITH TOILETRIES FOR YOUR OVERNIGHT STAY IF YOU WISH.  YOUR PRESCRIPTION MEDICATIONS WILL BE PROVIDED DURING [facility].                                      DO NOT WEAR JEWERLY/  METAL/  PIERCINGS (INCLUDING NO PLASTIC  PIERCINGS) DO NOT WEAR LOTIONS, POWDERS, PERFUMES OR NAIL POLISH ON YOUR FINGERNAILS. TOENAIL POLISH IS OK TO WEAR. DO NOT SHAVE FOR 48 HOURS PRIOR TO DAY OF SURGERY.  CONTACTS, GLASSES, OR DENTURES MAY NOT BE WORN TO SURGERY.  REMEMBER: NO SMOKING, VAPING ,  DRUGS OR ALCOHOL FOR 24 HOURS BEFORE YOUR SURGERY.  [facility] IS NOT RESPONSIBLE  FOR ANY BELONGINGS.                                                                    Marland Kitchen           [place] - Preparing for Surgery Before surgery, you can play an important role.  Because skin is not sterile, your skin needs to be as free of germs as possible.  You can reduce the number of germs on your skin by washing with CHG (chlorahexidine gluconate) soap before surgery.  CHG is an antiseptic cleaner which kills germs and bonds with the skin to continue killing germs even after washing. Please DO NOT use if you have an allergy to CHG or antibacterial soaps.  If your skin becomes reddened/irritated stop using the CHG and inform your nurse when you arrive at Short Stay. Do not shave (including legs and underarms) for at least 48 hours prior to the first CHG shower.  You may shave your face/neck. Please follow these instructions carefully:  1.  Shower with CHG Soap the night before surgery and the  morning of Surgery.  2.  If you choose to wash your hair, wash your hair first as usual with your  normal  shampoo.  3.  After you shampoo, rinse your hair and body thoroughly to remove the  shampoo.                                        4.  Use CHG as you would any other liquid soap.  You can apply chg directly  to the skin and wash , chg soap provided, night before and morning of your surgery.  5.  Apply the CHG Soap to your body ONLY FROM THE NECK DOWN.   Do not use on face/ open                           Wound or open sores. Avoid contact with eyes, ears mouth and genitals (private parts).                       Wash  face,  Genitals (private parts) with your normal soap.             6.  Wash thoroughly, paying special attention to the area where your surgery  will be performed.  7.  Thoroughly rinse your body with warm water from the neck down.  8.  DO NOT shower/wash with your normal soap after using and rinsing off  the CHG Soap.             9.  Pat yourself dry with a clean towel.            10.  Wear clean pajamas.            11.  Place clean sheets on your bed the night of your first shower and do not  sleep with pets. Day of Surgery : Do not apply any lotions/ powders the morning of surgery.  Please wear clean clothes to the hospital/surgery  center.  IF YOU HAVE ANY SKIN IRRITATION OR PROBLEMS WITH THE SURGICAL SOAP, PLEASE GET A BAR OF GOLD DIAL SOAP AND SHOWER THE NIGHT BEFORE YOUR SURGERY AND THE MORNING OF YOUR SURGERY. PLEASE LET THE NURSE KNOW MORNING OF YOUR SURGERY IF YOU HAD ANY PROBLEMS WITH THE SURGICAL SOAP.   ________________________________________________________________________                                                        QUESTIONS [facility] PRE OP NURSE PHONE [phone].

## 2023-02-24 ENCOUNTER — Encounter (HOSPITAL_COMMUNITY)
Admission: RE | Admit: 2023-02-24 | Discharge: 2023-02-24 | Disposition: A | Discharge status: Home / Self Care | Payer: Medicare HMO | Source: Ambulatory Visit | Attending: Obstetrics and Gynecology | Admitting: Obstetrics and Gynecology

## 2023-02-24 DIAGNOSIS — Z01818 Encounter for other preprocedural examination: Secondary | ICD-10-CM

## 2023-02-24 LAB — BASIC METABOLIC PANEL
Anion gap: 6 (ref 5–15)
BUN: 8 mg/dL (ref 8–23)
CO2: 26 mmol/L (ref 22–32)
Calcium: 9.3 mg/dL (ref 8.9–10.3)
Chloride: 105 mmol/L (ref 98–111)
Creatinine, Ser: 0.73 mg/dL (ref 0.44–1.00)
GFR, Estimated: >60 mL/min (ref >60)
Glucose, Bld: 130 mg/dL — ABNORMAL HIGH (ref 70–99)
Potassium: 3.9 mmol/L (ref 3.5–5.1)
Sodium: 137 mmol/L (ref 135–145)

## 2023-02-24 LAB — CBC
HCT: 34.5 % — ABNORMAL LOW (ref 36.0–46.0)
Hemoglobin: 10.1 g/dL — ABNORMAL LOW (ref 12.0–15.0)
MCH: 19.9 pg — ABNORMAL LOW (ref 26.0–34.0)
MCHC: 29.3 g/dL — ABNORMAL LOW (ref 30.0–36.0)
MCV: 68 fL — ABNORMAL LOW (ref 80.0–100.0)
Platelets: 389 10*3/uL (ref 150–400)
RBC: 5.07 MIL/uL (ref 3.87–5.11)
RDW: 18.7 % — ABNORMAL HIGH (ref 11.5–15.5)
WBC: 7.5 10*3/uL (ref 4.0–10.5)
nRBC: 0 % (ref 0.0–0.2)

## 2023-02-24 LAB — HIV ANTIBODY (ROUTINE TESTING W REFLEX): HIV Screen 4th Generation wRfx: NONREACTIVE

## 2023-02-24 NOTE — Progress Notes (Signed)
Routed cbc results to Dr. Ivin Poot.

## 2023-03-02 ENCOUNTER — Encounter (HOSPITAL_COMMUNITY)
Admission: AD | Disposition: A | Discharge status: Home / Self Care | Payer: Self-pay | Source: Home / Self Care | Attending: Obstetrics and Gynecology

## 2023-03-02 ENCOUNTER — Encounter (HOSPITAL_COMMUNITY): Payer: Self-pay | Admitting: Obstetrics and Gynecology

## 2023-03-02 ENCOUNTER — Observation Stay (HOSPITAL_COMMUNITY)
Admission: AD | Admit: 2023-03-02 | Discharge: 2023-03-04 | Disposition: A | Discharge status: Home / Self Care | Payer: Medicare HMO | Attending: Obstetrics and Gynecology | Admitting: Obstetrics and Gynecology

## 2023-03-02 ENCOUNTER — Ambulatory Visit (HOSPITAL_COMMUNITY): Payer: Medicare HMO | Admitting: Anesthesiology

## 2023-03-02 ENCOUNTER — Other Ambulatory Visit: Payer: Self-pay

## 2023-03-02 DIAGNOSIS — M1990 Unspecified osteoarthritis, unspecified site: Secondary | ICD-10-CM

## 2023-03-02 DIAGNOSIS — N736 Female pelvic peritoneal adhesions (postinfective): Secondary | ICD-10-CM

## 2023-03-02 DIAGNOSIS — D630 Anemia in neoplastic disease: Secondary | ICD-10-CM | POA: Diagnosis not present

## 2023-03-02 DIAGNOSIS — G4730 Sleep apnea, unspecified: Secondary | ICD-10-CM | POA: Insufficient documentation

## 2023-03-02 DIAGNOSIS — N924 Excessive bleeding in the premenopausal period: Secondary | ICD-10-CM | POA: Diagnosis not present

## 2023-03-02 DIAGNOSIS — N8003 Adenomyosis of the uterus: Secondary | ICD-10-CM | POA: Diagnosis present

## 2023-03-02 DIAGNOSIS — G8929 Other chronic pain: Secondary | ICD-10-CM | POA: Diagnosis present

## 2023-03-02 DIAGNOSIS — E119 Type 2 diabetes mellitus without complications: Secondary | ICD-10-CM | POA: Diagnosis not present

## 2023-03-02 DIAGNOSIS — Z01818 Encounter for other preprocedural examination: Secondary | ICD-10-CM

## 2023-03-02 DIAGNOSIS — E7800 Pure hypercholesterolemia, unspecified: Secondary | ICD-10-CM | POA: Diagnosis present

## 2023-03-02 DIAGNOSIS — T4145XA Adverse effect of unspecified anesthetic, initial encounter: Secondary | ICD-10-CM | POA: Diagnosis not present

## 2023-03-02 DIAGNOSIS — D259 Leiomyoma of uterus, unspecified: Secondary | ICD-10-CM

## 2023-03-02 DIAGNOSIS — Z90710 Acquired absence of both cervix and uterus: Secondary | ICD-10-CM | POA: Diagnosis present

## 2023-03-02 DIAGNOSIS — R001 Bradycardia, unspecified: Secondary | ICD-10-CM

## 2023-03-02 DIAGNOSIS — N7011 Chronic salpingitis: Secondary | ICD-10-CM | POA: Diagnosis present

## 2023-03-02 DIAGNOSIS — Z7984 Long term (current) use of oral hypoglycemic drugs: Secondary | ICD-10-CM

## 2023-03-02 DIAGNOSIS — D649 Anemia, unspecified: Secondary | ICD-10-CM | POA: Insufficient documentation

## 2023-03-02 DIAGNOSIS — F17210 Nicotine dependence, cigarettes, uncomplicated: Secondary | ICD-10-CM | POA: Diagnosis present

## 2023-03-02 DIAGNOSIS — R569 Unspecified convulsions: Secondary | ICD-10-CM | POA: Diagnosis present

## 2023-03-02 DIAGNOSIS — D62 Acute posthemorrhagic anemia: Secondary | ICD-10-CM | POA: Diagnosis present

## 2023-03-02 DIAGNOSIS — D509 Iron deficiency anemia, unspecified: Secondary | ICD-10-CM | POA: Diagnosis present

## 2023-03-02 DIAGNOSIS — Z6841 Body Mass Index (BMI) 40.0 and over, adult: Secondary | ICD-10-CM

## 2023-03-02 DIAGNOSIS — I9581 Postprocedural hypotension: Secondary | ICD-10-CM | POA: Diagnosis not present

## 2023-03-02 DIAGNOSIS — Z79899 Other long term (current) drug therapy: Secondary | ICD-10-CM

## 2023-03-02 DIAGNOSIS — G4733 Obstructive sleep apnea (adult) (pediatric): Secondary | ICD-10-CM | POA: Diagnosis present

## 2023-03-02 DIAGNOSIS — Z86718 Personal history of other venous thrombosis and embolism: Secondary | ICD-10-CM

## 2023-03-02 DIAGNOSIS — N920 Excessive and frequent menstruation with regular cycle: Principal | ICD-10-CM | POA: Diagnosis present

## 2023-03-02 HISTORY — DX: Anemia, unspecified: D64.9

## 2023-03-02 HISTORY — DX: Unspecified convulsions: R56.9

## 2023-03-02 HISTORY — DX: Presence of spectacles and contact lenses: Z97.3

## 2023-03-02 HISTORY — DX: Hyperlipidemia, unspecified: E78.5

## 2023-03-02 HISTORY — DX: Type 2 diabetes mellitus without complications: E11.9

## 2023-03-02 HISTORY — DX: Presence of dental prosthetic device (complete) (partial): Z97.2

## 2023-03-02 HISTORY — DX: Dependence on other enabling machines and devices: Z99.89

## 2023-03-02 HISTORY — PX: ROBOTIC ASSISTED LAPAROSCOPIC HYSTERECTOMY AND SALPINGECTOMY: SHX6379

## 2023-03-02 LAB — TYPE AND SCREEN
ABO/RH(D): O POS
Antibody Screen: NEGATIVE

## 2023-03-02 LAB — POCT PREGNANCY, URINE: Preg Test, Ur: NEGATIVE

## 2023-03-02 LAB — CBC
HCT: 33 % — ABNORMAL LOW (ref 36.0–46.0)
Hemoglobin: 9.5 g/dL — ABNORMAL LOW (ref 12.0–15.0)
MCH: 20.4 pg — ABNORMAL LOW (ref 26.0–34.0)
MCHC: 28.8 g/dL — ABNORMAL LOW (ref 30.0–36.0)
MCV: 70.8 fL — ABNORMAL LOW (ref 80.0–100.0)
Platelets: 336 10*3/uL (ref 150–400)
RBC: 4.66 MIL/uL (ref 3.87–5.11)
RDW: 18.1 % — ABNORMAL HIGH (ref 11.5–15.5)
WBC: 15.3 10*3/uL — ABNORMAL HIGH (ref 4.0–10.5)
nRBC: 0 % (ref 0.0–0.2)

## 2023-03-02 LAB — GLUCOSE, CAPILLARY: Glucose-Capillary: 171 mg/dL — ABNORMAL HIGH (ref 70–99)

## 2023-03-02 SURGERY — XI ROBOTIC ASSISTED LAPAROSCOPIC HYSTERECTOMY AND SALPINGECTOMY
Anesthesia: General | Site: Pelvis

## 2023-03-02 MED ORDER — IBUPROFEN 400 MG PO TABS
600.0000 mg | ORAL_TABLET | Freq: Four times a day (QID) | ORAL | Status: DC
  Administered 2023-03-04 (×2): 600 mg via ORAL
  Filled 2023-03-02 (×2): qty 1

## 2023-03-02 MED ORDER — ACETAMINOPHEN 10 MG/ML IV SOLN
INTRAVENOUS | Status: DC | PRN
  Administered 2023-03-02: 1000 mg via INTRAVENOUS

## 2023-03-02 MED ORDER — ROCURONIUM BROMIDE 10 MG/ML (PF) SYRINGE
PREFILLED_SYRINGE | INTRAVENOUS | Status: DC | PRN
  Administered 2023-03-02 (×2): 20 mg via INTRAVENOUS
  Administered 2023-03-02: 30 mg via INTRAVENOUS
  Administered 2023-03-02: 50 mg via INTRAVENOUS

## 2023-03-02 MED ORDER — SODIUM CHLORIDE 0.9 % IV SOLN: INTRAVENOUS | Status: DC

## 2023-03-02 MED ORDER — ROCURONIUM BROMIDE 10 MG/ML (PF) SYRINGE
PREFILLED_SYRINGE | INTRAVENOUS | Status: AC
  Filled 2023-03-02: qty 10

## 2023-03-02 MED ORDER — SUCCINYLCHOLINE CHLORIDE 200 MG/10ML IV SOSY
PREFILLED_SYRINGE | INTRAVENOUS | Status: AC
  Filled 2023-03-02: qty 10

## 2023-03-02 MED ORDER — DOCUSATE SODIUM 100 MG PO CAPS: 100.0000 mg | ORAL_CAPSULE | Freq: Two times a day (BID) | ORAL | 0 refills | Status: AC

## 2023-03-02 MED ORDER — CEFAZOLIN IN SODIUM CHLORIDE 3-0.9 GM/100ML-% IV SOLN
3.0000 g | INTRAVENOUS | Status: DC
  Filled 2023-03-02: qty 100

## 2023-03-02 MED ORDER — ONDANSETRON HCL 4 MG/2ML IJ SOLN: 4.0000 mg | Freq: Four times a day (QID) | INTRAMUSCULAR | Status: DC | PRN

## 2023-03-02 MED ORDER — MIDAZOLAM HCL 5 MG/5ML IJ SOLN
INTRAMUSCULAR | Status: DC | PRN
  Administered 2023-03-02: 2 mg via INTRAVENOUS

## 2023-03-02 MED ORDER — KETOROLAC TROMETHAMINE 30 MG/ML IJ SOLN
30.0000 mg | Freq: Four times a day (QID) | INTRAMUSCULAR | Status: AC
  Administered 2023-03-02 – 2023-03-03 (×3): 30 mg via INTRAVENOUS
  Filled 2023-03-02 (×5): qty 1

## 2023-03-02 MED ORDER — DEXTROSE 5 % IV SOLN
INTRAVENOUS | Status: DC | PRN
  Administered 2023-03-02: 3 g via INTRAVENOUS

## 2023-03-02 MED ORDER — LACTATED RINGERS IV SOLN: INTRAVENOUS | Status: DC

## 2023-03-02 MED ORDER — FENTANYL CITRATE (PF) 100 MCG/2ML IJ SOLN
INTRAMUSCULAR | Status: DC | PRN
  Administered 2023-03-02: 50 ug via INTRAVENOUS
  Administered 2023-03-02: 150 ug via INTRAVENOUS
  Administered 2023-03-02: 50 ug via INTRAVENOUS

## 2023-03-02 MED ORDER — PHENYLEPHRINE 80 MCG/ML (10ML) SYRINGE FOR IV PUSH (FOR BLOOD PRESSURE SUPPORT)
PREFILLED_SYRINGE | INTRAVENOUS | Status: AC
  Filled 2023-03-02: qty 10

## 2023-03-02 MED ORDER — BUPIVACAINE HCL (PF) 0.5 % IJ SOLN
INTRAMUSCULAR | Status: AC
  Filled 2023-03-02: qty 30

## 2023-03-02 MED ORDER — ACETAMINOPHEN 500 MG PO TABS: 1000.0000 mg | ORAL_TABLET | Freq: Once | ORAL | Status: DC

## 2023-03-02 MED ORDER — HEMOSTATIC AGENTS (NO CHARGE) OPTIME
TOPICAL | Status: DC | PRN
  Administered 2023-03-02: 1 via TOPICAL

## 2023-03-02 MED ORDER — HYDROMORPHONE HCL 1 MG/ML IJ SOLN
0.2500 mg | INTRAMUSCULAR | Status: DC | PRN
  Administered 2023-03-02: 0.5 mg via INTRAVENOUS

## 2023-03-02 MED ORDER — FENTANYL CITRATE PF 50 MCG/ML IJ SOSY
PREFILLED_SYRINGE | INTRAMUSCULAR | Status: AC
  Filled 2023-03-02: qty 1

## 2023-03-02 MED ORDER — STERILE WATER FOR IRRIGATION IR SOLN
Status: DC | PRN
  Administered 2023-03-02: 1000 mL

## 2023-03-02 MED ORDER — HYDROMORPHONE HCL 1 MG/ML IJ SOLN
0.2000 mg | INTRAMUSCULAR | Status: DC | PRN
  Administered 2023-03-02 – 2023-03-03 (×2): 0.5 mg via INTRAVENOUS
  Filled 2023-03-02 (×2): qty 1

## 2023-03-02 MED ORDER — HYDROCODONE-ACETAMINOPHEN 10-325 MG PO TABS: 1.0000 | ORAL_TABLET | ORAL | 0 refills | Status: AC | PRN

## 2023-03-02 MED ORDER — ONDANSETRON HCL 4 MG/2ML IJ SOLN
INTRAMUSCULAR | Status: DC | PRN
  Administered 2023-03-02: 4 mg via INTRAVENOUS

## 2023-03-02 MED ORDER — LIDOCAINE 2% (20 MG/ML) 5 ML SYRINGE
INTRAMUSCULAR | Status: DC | PRN
  Administered 2023-03-02: 60 mg via INTRAVENOUS

## 2023-03-02 MED ORDER — PROPOFOL 500 MG/50ML IV EMUL
INTRAVENOUS | Status: AC
  Filled 2023-03-02: qty 50

## 2023-03-02 MED ORDER — ONDANSETRON HCL 4 MG/2ML IJ SOLN
INTRAMUSCULAR | Status: AC
  Filled 2023-03-02: qty 2

## 2023-03-02 MED ORDER — SODIUM CHLORIDE 0.9 % IR SOLN
Status: DC | PRN
  Administered 2023-03-02: 1000 mL

## 2023-03-02 MED ORDER — IBUPROFEN 600 MG PO TABS: 600.0000 mg | ORAL_TABLET | Freq: Four times a day (QID) | ORAL | 0 refills | Status: AC | PRN

## 2023-03-02 MED ORDER — POVIDONE-IODINE 10 % EX SWAB: 2.0000 | Freq: Once | CUTANEOUS | Status: DC

## 2023-03-02 MED ORDER — PROPOFOL 10 MG/ML IV BOLUS
INTRAVENOUS | Status: DC | PRN
  Administered 2023-03-02: 200 mg via INTRAVENOUS

## 2023-03-02 MED ORDER — LACTATED RINGERS IV SOLN: INTRAVENOUS | Status: DC | PRN

## 2023-03-02 MED ORDER — GABAPENTIN 300 MG PO CAPS: 300.0000 mg | ORAL_CAPSULE | Freq: Three times a day (TID) | ORAL | 0 refills | Status: DC

## 2023-03-02 MED ORDER — HYDROMORPHONE HCL 1 MG/ML IJ SOLN
INTRAMUSCULAR | Status: AC
  Administered 2023-03-02: 0.5 mg via INTRAVENOUS
  Filled 2023-03-02: qty 1

## 2023-03-02 MED ORDER — SIMETHICONE 80 MG PO CHEW
80.0000 mg | CHEWABLE_TABLET | Freq: Four times a day (QID) | ORAL | Status: DC | PRN
  Administered 2023-03-03 (×2): 80 mg via ORAL
  Filled 2023-03-02 (×2): qty 1

## 2023-03-02 MED ORDER — SIMETHICONE 80 MG PO CHEW: 80.0000 mg | CHEWABLE_TABLET | Freq: Four times a day (QID) | ORAL | 0 refills | Status: DC | PRN

## 2023-03-02 MED ORDER — ACETAMINOPHEN 10 MG/ML IV SOLN
INTRAVENOUS | Status: AC
  Filled 2023-03-02: qty 100

## 2023-03-02 MED ORDER — FENTANYL CITRATE PF 50 MCG/ML IJ SOSY
0.5000 ug | PREFILLED_SYRINGE | INTRAMUSCULAR | Status: DC | PRN
  Administered 2023-03-02: 0.5 ug via INTRAVENOUS

## 2023-03-02 MED ORDER — ONDANSETRON HCL 4 MG PO TABS: 4.0000 mg | ORAL_TABLET | Freq: Four times a day (QID) | ORAL | Status: DC | PRN

## 2023-03-02 MED ORDER — GABAPENTIN 100 MG PO CAPS
300.0000 mg | ORAL_CAPSULE | Freq: Three times a day (TID) | ORAL | Status: DC
  Administered 2023-03-02 – 2023-03-03 (×3): 300 mg via ORAL
  Filled 2023-03-02 (×3): qty 3

## 2023-03-02 MED ORDER — SUCCINYLCHOLINE CHLORIDE 200 MG/10ML IV SOSY
PREFILLED_SYRINGE | INTRAVENOUS | Status: DC | PRN
  Administered 2023-03-02: 150 mg via INTRAVENOUS

## 2023-03-02 MED ORDER — DOCUSATE SODIUM 100 MG PO CAPS
100.0000 mg | ORAL_CAPSULE | Freq: Two times a day (BID) | ORAL | Status: DC
  Administered 2023-03-02 – 2023-03-04 (×4): 100 mg via ORAL
  Filled 2023-03-02 (×4): qty 1

## 2023-03-02 MED ORDER — MIDAZOLAM HCL 2 MG/2ML IJ SOLN
INTRAMUSCULAR | Status: AC
  Filled 2023-03-02: qty 2

## 2023-03-02 MED ORDER — LIDOCAINE HCL (PF) 2 % IJ SOLN
INTRAMUSCULAR | Status: AC
  Filled 2023-03-02: qty 5

## 2023-03-02 MED ORDER — DEXAMETHASONE SODIUM PHOSPHATE 10 MG/ML IJ SOLN
INTRAMUSCULAR | Status: AC
  Filled 2023-03-02: qty 1

## 2023-03-02 MED ORDER — FLEET ENEMA 7-19 GM/118ML RE ENEM: 1.0000 | ENEMA | Freq: Once | RECTAL | Status: DC | PRN

## 2023-03-02 MED ORDER — DEXAMETHASONE SODIUM PHOSPHATE 10 MG/ML IJ SOLN
INTRAMUSCULAR | Status: DC | PRN
  Administered 2023-03-02: 5 mg via INTRAVENOUS

## 2023-03-02 MED ORDER — BUPIVACAINE HCL (PF) 0.5 % IJ SOLN
INTRAMUSCULAR | Status: DC | PRN
  Administered 2023-03-02: 11 mL
  Administered 2023-03-02: 8 mL

## 2023-03-02 MED ORDER — SUGAMMADEX SODIUM 200 MG/2ML IV SOLN
INTRAVENOUS | Status: DC | PRN
  Administered 2023-03-02: 200 mg via INTRAVENOUS

## 2023-03-02 MED ORDER — HYDROCODONE-ACETAMINOPHEN 5-325 MG PO TABS
2.0000 | ORAL_TABLET | ORAL | Status: DC | PRN
  Administered 2023-03-03 (×2): 2 via ORAL
  Filled 2023-03-02 (×2): qty 2

## 2023-03-02 MED ORDER — FENTANYL CITRATE (PF) 250 MCG/5ML IJ SOLN
INTRAMUSCULAR | Status: AC
  Filled 2023-03-02: qty 5

## 2023-03-02 SURGICAL SUPPLY — 55 items
ADH SKN CLS APL DERMABOND .7 (GAUZE/BANDAGES/DRESSINGS) ×1
APL PRP STRL LF DISP 70% ISPRP (MISCELLANEOUS) ×2
APL SRG 38 LTWT LNG FL B (MISCELLANEOUS) ×1
APPLICATOR ARISTA FLEXITIP XL (MISCELLANEOUS) IMPLANT
BNDG ADH 1X3 SHEER STRL LF (GAUZE/BANDAGES/DRESSINGS) IMPLANT
BNDG ADH THN 3X1 STRL LF (GAUZE/BANDAGES/DRESSINGS) ×1
CHLORAPREP W/TINT 26 (MISCELLANEOUS) IMPLANT
COVER BACK TABLE 60X90IN (DRAPES) IMPLANT
COVER TIP SHEARS 8 DVNC (MISCELLANEOUS) IMPLANT
COVER TIP SHEARS 8MM DA VINCI (MISCELLANEOUS) ×1
DEFOGGER SCOPE WARMER CLEARIFY (MISCELLANEOUS) IMPLANT
DERMABOND ADVANCED .7 DNX12 (GAUZE/BANDAGES/DRESSINGS) IMPLANT
DRAPE ARM DVNC X/XI (DISPOSABLE) IMPLANT
DRAPE COLUMN DVNC XI (DISPOSABLE) IMPLANT
DRAPE DA VINCI XI ARM (DISPOSABLE) ×4
DRAPE DA VINCI XI COLUMN (DISPOSABLE) ×1
DRAPE SHEET LG 3/4 BI-LAMINATE (DRAPES) IMPLANT
DRAPE SURG IRRIG POUCH 19X23 (DRAPES) IMPLANT
ELECT REM PT RETURN 9FT PED (ELECTROSURGICAL) ×1
ELECTRODE REM PT RETRN 9FT PED (ELECTROSURGICAL) IMPLANT
GAUZE 4X4 16PLY ~~LOC~~+RFID DBL (SPONGE) IMPLANT
GLOVE BIO SURGEON STRL SZ7 (GLOVE) IMPLANT
GLOVE BIOGEL PI IND STRL 7.0 (GLOVE) IMPLANT
HEMOSTAT ARISTA ABSORB 3G PWDR (HEMOSTASIS) IMPLANT
HOLDER FOLEY CATH W/STRAP (MISCELLANEOUS) IMPLANT
IRRIG SUCT STRYKERFLOW 2 WTIP (MISCELLANEOUS) ×1
IRRIGATION SUCT STRKRFLW 2 WTP (MISCELLANEOUS) IMPLANT
LEGGING LITHOTOMY PAIR STRL (DRAPES) IMPLANT
MANIPULATOR VCARE LG CRV RETR (MISCELLANEOUS) IMPLANT
NDL INSUFFLATION 14GA 120MM (NEEDLE) IMPLANT
NEEDLE INSUFFLATION 14GA 120MM (NEEDLE) ×1 IMPLANT
OBTURATOR OPTICAL STANDARD 8MM (TROCAR) ×1
OBTURATOR OPTICAL STND 8 DVNC (TROCAR) ×1
OBTURATOR OPTICALSTD 8 DVNC (TROCAR) IMPLANT
OCCLUDER COLPOPNEUMO (BALLOONS) IMPLANT
PACK ROBOT WH (CUSTOM PROCEDURE TRAY) IMPLANT
PACK ROBOTIC GOWN (GOWN DISPOSABLE) IMPLANT
PAD OB MATERNITY 4.3X12.25 (PERSONAL CARE ITEMS) IMPLANT
PAD PREP 24X48 CUFFED NSTRL (MISCELLANEOUS) IMPLANT
SCISSORS LAP 5X35 DISP (ENDOMECHANICALS) IMPLANT
SEAL UNIV 5-12 XI (MISCELLANEOUS) IMPLANT
SEAL XI UNIVERSAL 5-12 (MISCELLANEOUS) ×4
SEALER VESSEL DA VINCI XI (MISCELLANEOUS) ×1
SEALER VESSEL EXT DVNC XI (MISCELLANEOUS) IMPLANT
SET IRRIG Y TYPE TUR BLADDER L (SET/KITS/TRAYS/PACK) IMPLANT
SET TUBE FILTERED XL AIRSEAL (SET/KITS/TRAYS/PACK) IMPLANT
SUT MNCRL AB 4-0 PS2 18 (SUTURE) IMPLANT
SUT VIC AB 0 CT1 27 (SUTURE) ×3
SUT VIC AB 0 CT1 27XBRD ANTBC (SUTURE) IMPLANT
SUT VLOC 180 2-0 9IN GS21 (SUTURE) IMPLANT
TAPE STRIPS DRAPE STRL (GAUZE/BANDAGES/DRESSINGS) IMPLANT
TOWEL OR 17X24 6PK STRL BLUE (TOWEL DISPOSABLE) IMPLANT
TRAY FOLEY MTR SLVR 14FR STAT (SET/KITS/TRAYS/PACK) IMPLANT
TROCAR PORT AIRSEAL 8X100 (TROCAR) IMPLANT
WATER STERILE IRR 1000ML POUR (IV SOLUTION) IMPLANT

## 2023-03-02 NOTE — Anesthesia Procedure Notes (Signed)
Procedure Name: Intubation Date/Time: 03/02/2023 1:39 PM  Performed by: Bonney Aid, CRNAPre-anesthesia Checklist: Patient identified, Emergency Drugs available, Suction available and Patient being monitored Patient Re-evaluated:Patient Re-evaluated prior to induction Oxygen Delivery Method: Circle system utilized Preoxygenation: Pre-oxygenation with 100% oxygen Induction Type: IV induction Ventilation: Mask ventilation without difficulty Laryngoscope Size: Glidescope and 4 Grade View: Grade I Tube type: Oral Tube size: 7.0 mm Number of attempts: 1 Airway Equipment and Method: Stylet Placement Confirmation: ETT inserted through vocal cords under direct vision, positive ETCO2 and breath sounds checked- equal and bilateral Secured at: 20 cm Tube secured with: Tape Dental Injury: Teeth and Oropharynx as per pre-operative assessment

## 2023-03-02 NOTE — Anesthesia Preprocedure Evaluation (Addendum)
Anesthesia Evaluation  Patient identified by MRN, date of birth, ID band Patient awake    Reviewed: Allergy & Precautions, H&P , NPO status , Patient's Chart, lab work & pertinent test results  Airway Mallampati: II  TM Distance: >3 FB Neck ROM: Full    Dental no notable dental hx. (+) Edentulous Upper, Dental Advisory Given   Pulmonary Current Smoker and Patient abstained from smoking.   Pulmonary exam normal breath sounds clear to auscultation       Cardiovascular negative cardio ROS  Rhythm:Regular Rate:Normal     Neuro/Psych negative neurological ROS  negative psych ROS   GI/Hepatic negative GI ROS, Neg liver ROS,,,  Endo/Other  diabetes, Type 2, Oral Hypoglycemic Agents  Morbid obesity  Renal/GU negative Renal ROS  negative genitourinary   Musculoskeletal  (+) Arthritis , Osteoarthritis,    Abdominal   Peds  Hematology  (+) Blood dyscrasia, anemia   Anesthesia Other Findings   Reproductive/Obstetrics negative OB ROS                             Anesthesia Physical Anesthesia Plan  ASA: 3  Anesthesia Plan: General   Post-op Pain Management: Ofirmev IV (intra-op)*   Induction: Intravenous  PONV Risk Score and Plan: 3 and Ondansetron, Dexamethasone and Midazolam  Airway Management Planned: Oral ETT and Video Laryngoscope Planned  Additional Equipment:   Intra-op Plan:   Post-operative Plan: Extubation in OR  Informed Consent: I have reviewed the patients History and Physical, chart, labs and discussed the procedure including the risks, benefits and alternatives for the proposed anesthesia with the patient or authorized representative who has indicated his/her understanding and acceptance.     Dental advisory given  Plan Discussed with: CRNA  Anesthesia Plan Comments:        Anesthesia Quick Evaluation

## 2023-03-02 NOTE — Transfer of Care (Signed)
Immediate Anesthesia Transfer of Care Note  Patient: Amy Mullins  Procedure(s) Performed: Procedure(s): XI ROBOTIC ASSISTED LAPAROSCOPIC HYSTERECTOMY AND SALPINGECTOMY (N/A)  Patient Location: PACU  Anesthesia Type:General  Level of Consciousness: Alert, Awake, Oriented  Airway & Oxygen Therapy: Patient Spontanous Breathing  Post-op Assessment: Report given to RN  Post vital signs: Reviewed and stable  Last Vitals:  Vitals:   03/02/23 1212  BP: 129/79  Pulse: 65  Resp: 18  Temp: 36.5 C  SpO2: [id number]    Complications: No apparent anesthesia complications

## 2023-03-02 NOTE — Op Note (Signed)
03/02/2023  8:15 PM  PATIENT:  Amy Mullins  62 y.o. female  PRE-OPERATIVE DIAGNOSIS:  EXCESSIVE AND FREQUENT MENSTRATION WITH REGULAR CYCLE Menorrhagia Delayed menopause Uterine adenomyosis Fibroid uterus  POST-OPERATIVE DIAGNOSIS:  EXCESSIVE AND FREQUENT MENSTRATION WITH REGULAR CYCLE Menorrhagia Delayed menopause Uterine adenomyosis Fibroid uterus Copious pelvic adhesions Bilateral hydrosalpinx  PROCEDURE:  Procedure(s): XI ROBOTIC ASSISTED LAPAROSCOPIC HYSTERECTOMY AND SALPINGECTOMY (N/A)  SURGEON:  Surgeon(s) and Role:    * Josefine Class, MD - Primary    * Waymon Amato, MD - Assisting  PHYSICIAN ASSISTANT:   ASSISTANTS: Dr. Alesia Richards   ANESTHESIA:   general  EBL:  100 mL   BLOOD ADMINISTERED:none  DRAINS: Urinary Catheter (Foley) removed immediately post-op while in OR. Drain 200 cc clear urine  LOCAL MEDICATIONS USED:  MARCAINE     SPECIMEN:  Uterus, cervix, and bilateral fallopian tubes  DISPOSITION OF SPECIMEN:  PATHOLOGY  COUNTS:  YES  DICTATION:  Surgical Risks: The patient was informed of the risks and benefits of a robotic-assisted total hysterectomy with bilateral salpingectomy. Risks included but were not limited to bleeding, infection, and injury to surrounding organs or tissues. The patient was counseled extensively that pregnancy is not possible following hysterectomy and patient expressed understanding of this.  The patient was counseled on possible laparotomy if extensive adhesions or bleeding were encountered. The patient expressed understanding of the risks involved with the procedure, all questions were answered, and the patient consented to the procedure.   Description of Procedure: The patient was taken to the operating room where a time-out was performed to confirm correct patient and correct procedure. The patient was administered prophylactic IV antibiotics. The patient was then positioned on the operating table in the dorsal lithotomy  position with the legs supported using stirrups. General anesthesia was adequately established. All pressure points were padded and a Bair hugger was placed to maintain control of core body temperature. The abdomen and vagina were prepped and draped in the usual sterile fashion.   Operative Technique: A bimanual exam was performed and uterus is found to be enlarged in size and boggy. There were no palpable adnexal masses. A weighted speculum was placed in the vagina. The anterior lip of the cervix was visualized and grasped using a single-tooth tenaculum. Uterus sounded to 10 cm. The cervix was carefully dilated for the introduction of a large VCare uterine manipulator to be inserted through the cervix. The VCare was secured in place with 0-Vicryl suture. Following placement of the uterine manipulator, the tenaculum and weighted speculum was removed from the vagina. A Foley catheter was inserted into the bladder.    Attention was then turned to the abdomen where an 8 mm infraumbilical incision was made after injecting with local. A Veress needle was inserted through the incision and adequate pneumoperitoneum was obtained. An 8 mm Da Vinci trocar was inserted into the abdomen under direct visualization. This was repeated on the left side x 2 and on the right side under direct visualization without difficulty. The abdomen was then thoroughly inspected and there was no evidence of injury to the bowels, bladder, or vasculature. The patient was placed into Trendelenburg and the robotic device was docked. Intraoperative findings showed enlarged boggy uterus, copious pelvic adhesions, and bilateral hydrosalpinx.   Attention was turned to the left adnexa, which was buried under the pelvic adhesions. Copious filmy pelvic adhesions visualized and carefully taken down to adequately visualize anatomy.   Attention was turned to the right side of the uterus. The round  ligament, fallopian tube, and utero-ovarian ligament  was cauterized and transected at point of insertion on the uterus. The broad ligament was cauterized down to the level of the right uterine artery. The peritoneum of the bladder flap was lifted up and incised carefully in an elliptical fashion to created the bladder flap. The underlying tissue was then dissected and the underlying bladder was further retracted off the lower uterine segment and off the cervix anteriorly. The posterior leaf of the broad ligament was opened to the level of the uterosacral ligament. Cauterization of the right uterine artery was then performed.   Attention was then turned back to the left side of the uterus. This was repeated in a similar fashion on the left side of the uterus down to the level of the uterine artery. The rest of the bladder flap was created on the left-hand side and the posterior reflection of the peritoneum was taken down to the uterosacral ligament. Cauterization was performed of the left uterine artery and then transected. The cardinal ligament was cauterized and transected. Colpotomy was performed starting off at the 12 o'clock position in a clockwise rotation around the cervicovaginal junction. With the uterus freed with the attached cervix, the specimen was then extracted through the vagina. The pelvis was copiously irrigated and aspirated.   Attention was turned to the left fallopian tube and the underlying mesosalpinx was sequentially cauterized and cut freeing the left tube. This was repeated on the right side. Bilateral fallopian tubes removed through the vaginal.   The vaginal cuff was cauterized to obtain excellent hemostasis. The vaginal cuff was then closed with 2-0 Vloc suture x 2 starting on the corners and overlapping at midline. The pelvis was copiously irrigated and aspirated again. Bilateral ureters visualized and peristalsis seen. Great hemostasis was confirmed. Arista applied to vaginal cuff. The trochars removed under direct visualization.  The skin was closed with 4-0 Monocryl.   Attention was then turned to the bladder where the Foley catheter was removed.     At the end of the procedure, all needle, sponge, and instrument counts were noted to be correct  x 2. The patient tolerated the procedure well and was transferred to the recovery room in stable condition. Dr. Ivin Poot was present and participated in all portions of the procedure.  I was present and scrubbed and the assistant was required due to complexity of anatomy. Due to the complexity of the case, Dr. Alesia Richards  was asked to serve as surgeon assist. The assistant surgeon aided in safe handling of the pelvic structures, associated retraction, and exposure of the operative field.    PLAN OF CARE: Admit for overnight observation  PATIENT DISPOSITION:  PACU - hemodynamically stable.   Dr. Bing Matter

## 2023-03-02 NOTE — H&P (Signed)
Amy Mullins is an 62 y.o. female presenting for robotic assisted vaginal hysterectomy and bilateral salpingectomy for menorrhagia and adenomyosis. 11/18/2022 EMB demonstrates benign proliferative endometrium with polyp-like changes. Hewitt also drawn and low, not consistent with post-menopausal state as suspected as patient has been having consistent heavy regular menses. [place]: 12.4 x 9.0 x 6.8 cm, endometrial stripe 10.29cm (thickened), retro-flexed, normal appearing bilateral ovaries, 2 small fibroids largest measuring 2cm, likely adenomyosis. Patient has h/o of severe anemia requiring blood transfusions due to menorrhagia. Hgb currently 10.9 (02/24/2023) on iron therapy. Patient desires hysterectomy. Discussed plan to proceed with robotic hysterectomy and bilateral salpingectomy. Discussed procedure at length and R/B/A. Risk included, but not limited to infection, bleeding, damage to surrounding structures. S/p medical clearance.  OBHx: G5P5 - all vaginal delivery PMHx: DM2, OSA, Hypercholesterolemia, Anemia PSHx: Neck surgery, no abdominal surgeries FHx: Father-prostate cancer, Mother-DM2 Meds: Atorvastatin, Ferrous sulfate, Norco, Metformin, Rybelsus Social Hx: smokes 1ppd, denies drug or alcohol use  Pertinent Gynecological History: Menses: flow is heavy  Bleeding: dysfunctional uterine bleeding Contraception: none DES exposure: unknown Blood transfusions:  h/o blood transfusions in the past d/t menorrhagia Sexually transmitted diseases: no past history Previous GYN Procedures:  EMB   OB History: G5, P5   Menstrual History:  Patient's last menstrual period was 02/24/2023.    Past Medical History:  Diagnosis Date   Abnormal vaginal bleeding    Follows w/ Dr. Eyvonne Mechanic.   Ambulates with cane    Anemia    hx of anemia, most recent Hgb 7.1 on 09/28/22   Chronic back pain    Current smoker 2024   Diabetes mellitus without complication ([place])    Follows w/ Dr. Lesia Hausen @  [place].   DVT (deep venous thrombosis) ([place]) 06/11/2019   right leg   HLD (hyperlipidemia)    Follows with Dr. Lesia Hausen @ [place].   Obesity    Posterior vitreous detachment of right eye    Right knee DJD    Sciatica of left side    Seizures ([facility])    AS of 02/23/23, pt stated that she had seizures in the 1980's  and was on medication. She states that she stopped taking medication and has not had a seizure since the 1980's.   Uses walker    Wears dentures    Wears glasses     Past Surgical History:  Procedure Laterality Date   ANTERIOR CERVICAL DECOMP/DISCECTOMY FUSION N/A 06/04/2019   Procedure: ANTERIOR CERVICAL DECOMPRESSION/DISCECTOMY FUSION 1 LEVEL;  Surgeon: Judith Part, MD;  Location: [facility];  Service: Neurosurgery;  Laterality: N/A;  anterior    COLONOSCOPY WITH ESOPHAGOGASTRODUODENOSCOPY (EGD)     unsure of date   ENDOMETRIAL BIOPSY  11/18/2022   benign   TRIGGER FINGER RELEASE Right     Family History  Problem Relation Age of Onset   Prostate cancer Father     Social History:  reports that she has been smoking cigarettes. She has a 11.75 pack-year smoking history. She has never used smokeless tobacco. She reports current drug use. Drug: "Crack" cocaine. She reports that she does not drink alcohol.  Allergies: No Known Allergies  Medications Prior to Admission  Medication Sig Dispense Refill Last Dose   metFORMIN (GLUCOPHAGE) 500 MG tablet Take by mouth 2 (two) times daily with a meal.      Semaglutide (RYBELSUS) 7 MG TABS Take by mouth.      atorvastatin (LIPITOR) 20 MG tablet Take 20 mg by  mouth daily. (Patient not taking: Reported on 02/23/2023)   Not Taking   ferrous sulfate 324 MG TBEC Take 324 mg by mouth. (Patient not taking: Reported on 02/23/2023)   Not Taking   gabapentin (NEURONTIN) 300 MG capsule Take 300 mg by mouth 3 (three) times daily. (Patient not taking: Reported on 02/23/2023)   Not Taking    HYDROcodone-acetaminophen (NORCO) 10-325 MG tablet Take 1 tablet by mouth every 6 (six) hours as needed (for pain).       hydrOXYzine (ATARAX) 10 MG tablet Take 10 mg by mouth 3 (three) times daily as needed. (Patient not taking: Reported on 02/23/2023)   Not Taking   medroxyPROGESTERone (PROVERA) 10 MG tablet Take 1 tablet (10 mg total) by mouth daily. Use for ten days (Patient not taking: Reported on 02/23/2023) 10 tablet 12 Not Taking   norethindrone (MICRONOR) 0.35 MG tablet Take 1 tablet by mouth daily. (Patient not taking: Reported on 02/23/2023)   Not Taking   traZODone (DESYREL) 50 MG tablet Take 1 tablet (50 mg total) by mouth at bedtime as needed for sleep. (Patient not taking: Reported on 11/28/2019) 30 tablet 5     Review of Systems  All other systems reviewed and are negative.   Height 5\' 7"  (1.702 m), weight (!) 152 kg, last menstrual period 06/20/2019. Physical Exam Vitals reviewed.  Constitutional:      Appearance: She is obese.  Cardiovascular:     Rate and Rhythm: Normal rate.  Pulmonary:     Effort: Pulmonary effort is normal. No respiratory distress.  Abdominal:     Palpations: Abdomen is soft.  Genitourinary:    General: Normal vulva.  Skin:    General: Skin is warm and dry.  Neurological:     General: No focal deficit present.     Mental Status: She is alert and oriented to person, place, and time.  Psychiatric:        Mood and Affect: Mood normal.     No results found for this or any previous visit (from the past 24 hour(s)).  No results found.  Assessment/Plan: 62 yo G5P5 Menorrhagia Delayed menopause Uterine adenomyosis Fibroid uterus Type 2 diabetes mellitus Abnormal uterine bleeding Body mass index 40+ - severely obese Anemia Chronic pain Hypercholesterolemia Obstructive sleep apnea syndrome Vitamin D deficiency Tobacco use  Plan to proceed with robotic assisted vaginal hysterectomy and bilateral salpingectomy for menorrhagia and  adenomyosis. Discussed procedure at length and R/B/A. Risk included, but not limited to infection, bleeding, damage to surrounding structures. All questions answered to patient's satisfaction.  Josefine Class 03/02/2023, 12:01 PM

## 2023-03-02 NOTE — Anesthesia Postprocedure Evaluation (Signed)
Anesthesia Post Note  Patient: Amy Mullins  Procedure(s) Performed: XI ROBOTIC ASSISTED LAPAROSCOPIC HYSTERECTOMY AND SALPINGECTOMY (Pelvis)     Patient location during evaluation: PACU Anesthesia Type: General Level of consciousness: awake and alert Pain management: pain level controlled Vital Signs Assessment: post-procedure vital signs reviewed and stable Respiratory status: spontaneous breathing, nonlabored ventilation, respiratory function stable and patient connected to nasal cannula oxygen Cardiovascular status: blood pressure returned to baseline and stable Postop Assessment: no apparent nausea or vomiting Anesthetic complications: no  No notable events documented.  Last Vitals:  Vitals:   03/02/23 2119 03/02/23 2227  BP: 133/70 (!) 92/59  Pulse: 70 69  Resp: 16 16  Temp: 36.4 C (!) 36.4 C  SpO2: 98% 99%    Last Pain:  Vitals:   03/02/23 2227  TempSrc: Oral  PainSc:                  Lakena Sparlin,W. EDMOND

## 2023-03-03 ENCOUNTER — Encounter (HOSPITAL_COMMUNITY): Payer: Self-pay | Admitting: Obstetrics and Gynecology

## 2023-03-03 DIAGNOSIS — N8003 Adenomyosis of the uterus: Secondary | ICD-10-CM | POA: Diagnosis not present

## 2023-03-03 DIAGNOSIS — D259 Leiomyoma of uterus, unspecified: Secondary | ICD-10-CM | POA: Diagnosis not present

## 2023-03-03 DIAGNOSIS — N736 Female pelvic peritoneal adhesions (postinfective): Secondary | ICD-10-CM | POA: Diagnosis not present

## 2023-03-03 DIAGNOSIS — N924 Excessive bleeding in the premenopausal period: Secondary | ICD-10-CM | POA: Diagnosis not present

## 2023-03-03 LAB — CBC
HCT: 29.1 % — ABNORMAL LOW (ref 36.0–46.0)
Hemoglobin: 8.5 g/dL — ABNORMAL LOW (ref 12.0–15.0)
MCH: 20.2 pg — ABNORMAL LOW (ref 26.0–34.0)
MCHC: 29.2 g/dL — ABNORMAL LOW (ref 30.0–36.0)
MCV: 69.3 fL — ABNORMAL LOW (ref 80.0–100.0)
Platelets: 336 10*3/uL (ref 150–400)
RBC: 4.2 MIL/uL (ref 3.87–5.11)
RDW: 17.9 % — ABNORMAL HIGH (ref 11.5–15.5)
WBC: 13.5 10*3/uL — ABNORMAL HIGH (ref 4.0–10.5)
nRBC: 0 % (ref 0.0–0.2)

## 2023-03-03 LAB — BASIC METABOLIC PANEL
Anion gap: 9 (ref 5–15)
BUN: 13 mg/dL (ref 8–23)
CO2: 23 mmol/L (ref 22–32)
Calcium: 8.5 mg/dL — ABNORMAL LOW (ref 8.9–10.3)
Chloride: 103 mmol/L (ref 98–111)
Creatinine, Ser: 0.73 mg/dL (ref 0.44–1.00)
GFR, Estimated: >60 mL/min (ref >60)
Glucose, Bld: 224 mg/dL — ABNORMAL HIGH (ref 70–99)
Potassium: 4.2 mmol/L (ref 3.5–5.1)
Sodium: 135 mmol/L (ref 135–145)

## 2023-03-03 LAB — RPR: RPR Ser Ql: NONREACTIVE

## 2023-03-03 MED ORDER — LACTATED RINGERS IV BOLUS
500.0000 mL | Freq: Once | INTRAVENOUS | Status: AC
  Administered 2023-03-03: 500 mL via INTRAVENOUS

## 2023-03-03 NOTE — Progress Notes (Signed)
Dr. Bing Matter messaged nurse, asking about the CBC results and it showing processing. Nurse checked labs and saw where a BMP/CMP was done for 1600. Shared this with the doctor via messaging and that the nurse will call lab to follow up on this. Nurse called lab department and shared with them the order was not completed for CBC. Lab tech stated that they will put it in for 5am in the morning, to be drawn. Messaged the doctor, this information. She said thank you.

## 2023-03-03 NOTE — Discharge Summary (Deleted)
GYN Discharge Summary     Patient Name: Amy Mullins DOB: Apr 17, 1961 MRN: [id number]  Date of admission: 03/02/2023  Date of discharge: 03/03/2023  Admitting diagnosis: Status post hysterectomy [Z90.710] Secondary diagnosis:  Principal Problem:   Status post hysterectomy  Additional problems:  Menorrhagia Delayed menopause Uterine adenomyosis Fibroid uterus Type 2 diabetes mellitus Abnormal uterine bleeding Body mass index 40+ - severely obese Anemia Chronic pain Hypercholesterolemia Obstructive sleep apnea syndrome Vitamin D deficiency Tobacco use Copious pelvic adhesions Bilateral hydrosalpinx     Discharge diagnosis:   Status post hysterectomy [Z90.710]                                                                                              Complications: None  Hospital course:  Patient admitted for extended stay recover on 08/02/2023 after XI ROBOTIC ASSISTED LAPAROSCOPIC HYSTERECTOMY AND SALPINGECTOMY. Post-op course uncomplicated and patient discharged on POD#1.  Physical exam  Vitals:   03/02/23 2322 03/03/23 0017 03/03/23 0611 03/03/23 0613  BP: 115/62 102/62 (!) 90/54 (!) 102/55  Pulse: (!) 56 (!) 59 (!) 54   Resp: 16 16 18    Temp: 97.6 F (36.4 C) (!) 97.5 F (36.4 C) 97.6 F (36.4 C)   TempSrc: Oral Oral Oral   SpO2: 97% 96% 96%   Weight:      Height:       General: alert, cooperative, no distress, and morbidly obese Resp: clear to auscultation bilaterally Cardio: regular rate and rhythm GI: soft, non-tender; bowel sounds normal; no masses,  no organomegaly and incision: band aids x5 on 8 mm port sites c/d/i. Steri-strips over umbilical incision saturated in old dry blood. Extremities: extremities normal, atraumatic, no cyanosis or edema Vaginal Bleeding: none  Labs: Lab Results  Component Value Date   WBC 13.5 (H) 03/03/2023   HGB 8.5 (L) 03/03/2023   HCT 29.1 (L) 03/03/2023   MCV 69.3 (L) 03/03/2023   PLT 336 03/03/2023       Latest Ref Rng & Units 02/24/2023   11:25 AM  CMP  Glucose 70 - 99 mg/dL 130   BUN 8 - 23 mg/dL 8   Creatinine 0.44 - 1.00 mg/dL 0.73   Sodium 135 - 145 mmol/L 137   Potassium 3.5 - 5.1 mmol/L 3.9   Chloride 98 - 111 mmol/L 105   CO2 22 - 32 mmol/L 26   Calcium 8.9 - 10.3 mg/dL 9.3     Discharge instruction: Hysterectomy Care After Instructions  After visit meds:  Allergies as of 03/03/2023   No Known Allergies      Medication List     STOP taking these medications    hydrOXYzine 10 MG tablet Commonly known as: ATARAX   medroxyPROGESTERone 10 MG tablet Commonly known as: PROVERA   norethindrone 0.35 MG tablet Commonly known as: MICRONOR   traZODone 50 MG tablet Commonly known as: DESYREL       TAKE these medications    atorvastatin 20 MG tablet Commonly known as: LIPITOR Take 20 mg by mouth daily.   docusate sodium 100 MG capsule Commonly known as: Colace Take 1 capsule (  100 mg total) by mouth 2 (two) times daily for 10 days.   ferrous sulfate 324 MG Tbec Take 324 mg by mouth daily.   gabapentin 300 MG capsule Commonly known as: Neurontin Take 1 capsule (300 mg total) by mouth 3 (three) times daily for 5 days.   HYDROcodone-acetaminophen 10-325 MG tablet Commonly known as: Norco Take 1 tablet by mouth every 4 (four) hours as needed for up to 5 days. What changed:  when to take this reasons to take this   ibuprofen 600 MG tablet Commonly known as: ADVIL Take 1 tablet (600 mg total) by mouth every 6 (six) hours as needed for up to 10 days.   metFORMIN 500 MG tablet Commonly known as: GLUCOPHAGE Take 500 mg by mouth 2 (two) times daily with a meal.   Rybelsus 7 MG Tabs Generic drug: Semaglutide Take 7 mg by mouth daily.   simethicone 80 MG chewable tablet Commonly known as: Gas-X Chew 1 tablet (80 mg total) by mouth every 6 (six) hours as needed for up to 10 days for flatulence.        Diet:  heart healthy  Activity: Advance as  tolerated. Pelvic rest for 6 weeks.   Outpatient follow up:2 weeks Follow up Appt:No future appointments. Follow up Visit:No follow-ups on file.    03/03/2023 Josefine Class, MD

## 2023-03-03 NOTE — Progress Notes (Signed)
Mobility Specialist - Progress Note   03/03/23 1100  Mobility  Activity Ambulated with assistance in hallway  Level of Assistance Standby assist, set-up cues, supervision of patient - no hands on  Assistive Device Other (Comment) (iv pole)  Distance Ambulated (ft) 140 ft  Activity Response Tolerated well  Mobility Referral Yes  $Mobility charge 1 Mobility   Pt received in bed and agreed to mobility, with no c/o pain nor discomfort during session. Pt returned to bed with all needs met.   Roderick Pee Mobility Specialist

## 2023-03-03 NOTE — Progress Notes (Signed)
1 Day Post-Op Procedure(s) (LRB): XI ROBOTIC ASSISTED LAPAROSCOPIC HYSTERECTOMY AND SALPINGECTOMY (N/A)  Subjective: Patient reports incisional pain, tolerating PO, and no problems voiding. Denies n/v and not yet passing flatus.   Objective: I have reviewed patient's vital signs, intake and output, medications, and labs.  General: alert, cooperative, no distress, and morbidly obese Resp: clear to auscultation bilaterally Cardio: regular rate and rhythm GI: soft, non-tender; bowel sounds normal; no masses,  no organomegaly and incision: band aids x5 on 8 mm port sites c/d/i. Steri-strips over umbilical incision saturated in old dry blood. Extremities: extremities normal, atraumatic, no cyanosis or edema Vaginal Bleeding: none  Assessment: s/p Procedure(s): XI ROBOTIC ASSISTED LAPAROSCOPIC HYSTERECTOMY AND SALPINGECTOMY (N/A): stable, progressing well, and tolerating diet  Plan: Encourage ambulation Discontinue IV fluids Discharge home AM CBC pending  LOS: 0 days    Amy Class, MD 03/03/2023, 8:59 AM

## 2023-03-03 NOTE — Progress Notes (Signed)
Patient remained mildly hypotensive and bradycardic prior to discharge. Discharge order discontinued and ECG ordered. Reviewed result of EKG with on call cardiologist, Dr. Margaretann Loveless. Preliminary auto read states junctional rhythm, but sinus rhythm appreciated by cardiologist. As patient report she is light headed with ambulation, plan for patient to remain admitted until evaluated by cardiology. Will order TTE. Anticipate discharge when cleared by cardiology.   Dr. Bing Matter

## 2023-03-04 ENCOUNTER — Other Ambulatory Visit (HOSPITAL_COMMUNITY): Payer: Medicare HMO

## 2023-03-04 DIAGNOSIS — Z90710 Acquired absence of both cervix and uterus: Secondary | ICD-10-CM

## 2023-03-04 DIAGNOSIS — R001 Bradycardia, unspecified: Secondary | ICD-10-CM

## 2023-03-04 DIAGNOSIS — N924 Excessive bleeding in the premenopausal period: Secondary | ICD-10-CM | POA: Diagnosis not present

## 2023-03-04 LAB — CBC
HCT: 28.3 % — ABNORMAL LOW (ref 36.0–46.0)
Hemoglobin: 8.3 g/dL — ABNORMAL LOW (ref 12.0–15.0)
MCH: 20.4 pg — ABNORMAL LOW (ref 26.0–34.0)
MCHC: 29.3 g/dL — ABNORMAL LOW (ref 30.0–36.0)
MCV: 69.5 fL — ABNORMAL LOW (ref 80.0–100.0)
Platelets: 315 10*3/uL (ref 150–400)
RBC: 4.07 MIL/uL (ref 3.87–5.11)
RDW: 17.8 % — ABNORMAL HIGH (ref 11.5–15.5)
WBC: 9.7 10*3/uL (ref 4.0–10.5)
nRBC: 0 % (ref 0.0–0.2)

## 2023-03-04 LAB — SURGICAL PATHOLOGY

## 2023-03-04 NOTE — Consult Note (Addendum)
Cardiology Consultation   Patient ID: Amy Mullins MRN: [id number]; DOB: 01-10-1961  Admit date: 03/02/2023 Date of Consult: 03/04/2023  PCP:  Beverley Fiedler, [place] [facility] Cardiologist:  Mertie Moores, MD - new  Patient Profile:   Amy Mullins is a 62 y.o. female with a hx of HLD, DM, obesity, current tobacco abuse, and prior RL DVT 2020 no longer on [place] who is being seen 03/04/2023 for the evaluation of dizziness and abnormal EKG at the request of Dr. Ivin Poot.  History of Present Illness:   Ms. Amy Mullins has no known prior cardiac history. HLD managed with low dose lipitor; DM managed with metformin and semaglutide. She presented to [facility] for scheduled robotic assisted laparoscopic hysterectomy with salpingectomy for abnormal vaginal bleeding on 03/01/26. Post op course has been complicated by mild hypotension and bradycardia. Discharge was held yesterday for further cardiac workup. EKG reviewed with Dr. Margaretann Loveless last evening with overread of sinus bradycardia. In review of prior EKG, does not appear patient is bradycardic at baseline. She is on no anti-hypertensive or AV nodal blocking agents.   In review of vitals, HR in the 50s with SBP 90-110s. Does not appear that orthostatics have been collected.  During my interview, she states she was very concerned about her heart. She does smoke cigarettes and is DM. She confirms no prior cardiac history including heart failure or ischemic heart disease. Upon entering the room, she was up and doing chores in the room. I checked her pulse and felt she was elevated into the 70s. She denies chest pain, shortness of breath, orthopnea, and palpitations. She does seem to have MSK pain in her right shoulder that occurs with movement. She has grandchildren that keep her active.   Past Medical History:  Diagnosis Date   Abnormal vaginal bleeding    Follows w/ Dr. Eyvonne Mechanic.   Ambulates with cane    Anemia    hx of  anemia, most recent Hgb 7.1 on 09/28/22   Chronic back pain    Current smoker 2024   Diabetes mellitus without complication ([place])    Follows w/ Dr. Lesia Hausen @ [facility].   DVT (deep venous thrombosis) ([place]) 06/11/2019   right leg   HLD (hyperlipidemia)    Follows with Dr. Lesia Hausen @ .   Obesity    Posterior vitreous detachment of right eye    Right knee DJD    Sciatica of left side    Seizures (HCC)    AS of 02/23/23, pt stated that she had seizures in the 1980's  and was on medication. She states that she stopped taking medication and has not had a seizure since the 1980's.   Uses walker    Wears dentures    Wears glasses     Past Surgical History:  Procedure Laterality Date   ANTERIOR CERVICAL DECOMP/DISCECTOMY FUSION N/A 06/04/2019   Procedure: ANTERIOR CERVICAL DECOMPRESSION/DISCECTOMY FUSION 1 LEVEL;  Surgeon: Judith Part, MD;  Location: [facility];  Service: Neurosurgery;  Laterality: N/A;  anterior    COLONOSCOPY WITH ESOPHAGOGASTRODUODENOSCOPY (EGD)     unsure of date   ENDOMETRIAL BIOPSY  11/18/2022   benign   ROBOTIC ASSISTED LAPAROSCOPIC HYSTERECTOMY AND SALPINGECTOMY N/A 03/02/2023   Procedure: XI ROBOTIC ASSISTED LAPAROSCOPIC HYSTERECTOMY AND SALPINGECTOMY;  Surgeon: Josefine Class, MD;  Location: [facility];  Service: Gynecology;  Laterality: N/A;   TRIGGER FINGER RELEASE Right      Home Medications:  Prior to Admission medications   Medication Sig Start Date End Date Taking? Authorizing Provider  atorvastatin (LIPITOR) 20 MG tablet Take 20 mg by mouth daily.   Yes [provider]  docusate sodium (COLACE) 100 MG capsule Take 1 capsule (100 mg total) by mouth 2 (two) times daily for 10 days. 03/02/23 03/12/23 Yes Bing Matter D, MD  ferrous sulfate 324 MG TBEC Take 324 mg by mouth daily.   Yes [provider]  HYDROcodone-acetaminophen (NORCO) 10-325 MG tablet Take 1 tablet by mouth every 6 (six)  hours as needed (for pain).    Yes [provider]  HYDROcodone-acetaminophen (NORCO) 10-325 MG tablet Take 1 tablet by mouth every 4 (four) hours as needed for up to 5 days. 03/02/23 03/07/23 Yes Josefine Class, MD  ibuprofen (ADVIL) 600 MG tablet Take 1 tablet (600 mg total) by mouth every 6 (six) hours as needed for up to 10 days. 03/02/23 03/12/23 Yes Josefine Class, MD  metFORMIN (GLUCOPHAGE) 500 MG tablet Take 500 mg by mouth 2 (two) times daily with a meal.   Yes [provider]  Semaglutide (RYBELSUS) 7 MG TABS Take 7 mg by mouth daily.   Yes [provider]  simethicone (GAS-X) 80 MG chewable tablet Chew 1 tablet (80 mg total) by mouth every 6 (six) hours as needed for up to 10 days for flatulence. 03/02/23 03/12/23 Yes Josefine Class, MD    Inpatient Medications: Scheduled Meds:  docusate sodium  100 mg Oral BID   ibuprofen  600 mg Oral Q6H   Continuous Infusions:  sodium chloride 125 mL/hr at 03/03/23 0522   PRN Meds: HYDROcodone-acetaminophen, HYDROmorphone (DILAUDID) injection, ondansetron **OR** ondansetron (ZOFRAN) IV, simethicone, sodium phosphate  Allergies:   No Known Allergies  Social History:   Social History   Socioeconomic History   Marital status: Unknown    Spouse name: Not on file   Number of children: Not on file   Years of education: Not on file   Highest education level: Not on file  Occupational History   Not on file  Tobacco Use   Smoking status: Every Day    Packs/day: 0.25    Years: 47.00    Additional pack years: 0.00    Total pack years: 11.75    Types: Cigarettes   Smokeless tobacco: Never  Vaping Use   Vaping Use: Never used  Substance and Sexual Activity   Alcohol use: Never   Drug use: Yes    Types: "Crack" cocaine    Comment: Hasn't had any since the 1990's.   Sexual activity: Never  Other Topics Concern   Not on file  Social History Narrative   ** Merged History Encounter **       Social  Determinants of Health   Financial Resource Strain: Not on file  Food Insecurity: Patient Declined (03/02/2023)   Hunger Vital Sign    Worried About Running Out of Food in the Last Year: Patient declined    [place] in the Last Year: Patient declined  Transportation Needs: No Transportation Needs (03/02/2023)   PRAPARE - Hydrologist (Medical): No    Lack of Transportation (Non-Medical): No  Physical Activity: Not on file  Stress: Not on file  Social Connections: Not on file  Intimate Partner Violence: Not At Risk (03/02/2023)   Humiliation, Afraid, Rape, and Kick questionnaire    Fear of Current or Ex-Partner: No    Emotionally Abused:  No    Physically Abused: No    Sexually Abused: No    Family History:    Family History  Problem Relation Age of Onset   Prostate cancer Father      ROS:  Please see the history of present illness.   All other ROS reviewed and negative.     Physical Exam/Data:   Vitals:   03/03/23 0954 03/03/23 1323 03/03/23 2141 03/04/23 0551  BP: (!) 97/57 (!) 110/55 (!) 106/55 (!) 118/58  Pulse: (!) 55 (!) 56 (!) 50 (!) 57  Resp: 18 18 18 17   Temp: 97.7 F (36.5 C)  97.6 F (36.4 C) 97.7 F (36.5 C)  TempSrc: Oral  Oral Oral  SpO2: 100% 100% 100% 99%  Weight:      Height:        Intake/Output Summary (Last 24 hours) at 03/04/2023 0745 Last data filed at 03/03/2023 2200 Gross per 24 hour  Intake 1680 ml  Output 950 ml  Net 730 ml      03/02/2023   12:12 PM 03/02/2023   12:11 PM 02/23/2023   10:58 AM  Last 3 Weights  Weight (lbs) 335 lb 1.6 oz 335 lb 1.6 oz 335 lb  Weight (kg) 152 kg 152 kg 151.955 kg     Body mass index is 52.48 kg/m.  General:  obese female in NAD HEENT: normal Neck: no JVD Vascular: No carotid bruits; Distal pulses 2+ bilaterally Cardiac:  normal S1, S2; RRR; no murmur Lungs:  clear to auscultation bilaterally, no wheezing, rhonchi or rales  Abd: soft, nontender, no hepatomegaly   Ext: no edema Musculoskeletal:  No deformities, BUE and BLE strength normal and equal Skin: warm and dry  Neuro:  CNs 2-12 intact, no focal abnormalities noted Psych:  Normal affect   EKG:  The EKG was personally reviewed and demonstrates:  sinus bradycardia with HR 57, PVC Telemetry:  Telemetry was personally reviewed and demonstrates: N/A  Relevant CV Studies:  Echo pending  Laboratory Data:  High Sensitivity Troponin:  No results for input(s): "TROPONINIHS" in the last 720 hours.   Chemistry Recent Labs  Lab 03/03/23 1613  NA 135  K 4.2  CL 103  CO2 23  GLUCOSE 224*  BUN 13  CREATININE 0.73  CALCIUM 8.5*  GFRNONAA >60  ANIONGAP 9    No results for input(s): "PROT", "ALBUMIN", "AST", "ALT", "ALKPHOS", "BILITOT" in the last 168 hours. Lipids No results for input(s): "CHOL", "TRIG", "HDL", "LABVLDL", "LDLCALC", "CHOLHDL" in the last 168 hours.  Hematology Recent Labs  Lab 03/02/23 1908 03/03/23 0853 03/04/23 0507  WBC 15.3* 13.5* 9.7  RBC 4.66 4.20 4.07  HGB 9.5* 8.5* 8.3*  HCT 33.0* 29.1* 28.3*  MCV 70.8* 69.3* 69.5*  MCH 20.4* 20.2* 20.4*  MCHC 28.8* 29.2* 29.3*  RDW 18.1* 17.9* 17.8*  PLT 336 336 315   Thyroid No results for input(s): "TSH", "FREET4" in the last 168 hours.  BNPNo results for input(s): "BNP", "PROBNP" in the last 168 hours.  DDimer No results for input(s): "DDIMER" in the last 168 hours.   Radiology/Studies:  No results found.   Assessment and Plan:   Sinus bradycardia Soft BP - on no anti-hypertensive medications, no AV nodal blocking agents - pt dizzy with ambulation with mobility this morning but not dizzy with me ambulating around the room - echo pending - repeat EKG today with sinus bradycardia with HR 58 and first degree heart block - she walked around the room with me  and denies dizziness - will place on telemetry and walk the hall to obtain a crude measure of chronotropic competence - given mobility note this morning,  would obtain orthostatic vitasl - if echo is unremarkable, no further cardiac workup and can discharge from a cardiology standpoint   Acute on chronic anemia Likely due to abnormal vaginal bleeding Hb has drifted down to 8.3 today from 10.1 on 02/24/23 This could be contributing to dizziness, although pt active in the rooom with me without dizzines   Hyperlipidemia Continue 20 mg lipitor Given smoking history, would consider keeping LDL below 70, although no known disease   DM Current tobacco abuse Prior RL DVT - no longer on [place] Lung nodule on prior CT chest - follow up with primary care as repeat imaging was recommended - does not appear this was completed - per primary   I will arrange follow up with cardiology at [facility] in 1 month, once more recovered from her surgery.   Risk Assessment/Risk Scores:      For questions or updates, please contact [facility] Please consult [email or web address] for contact info under    Signed, Ledora Bottcher, [place]  03/04/2023 7:45 AM    Attending Note:   The patient was seen and examined.  Agree with assessment and plan as noted above.  Changes made to the above note as needed.  Patient seen and independently examined with Doreene Adas, PA .   We discussed all aspects of the encounter. I agree with the assessment and plan as stated above.    Sinus bradycardia:   pt has asymptomatic sinus bradycardia following laparoscopic hysterectomy.   HR is now normal .  She is ambulating in the halls I suspect this was due to the anesthesia.  She has never had any syncope,   She may follow up with her primary MD and does not need cardiology follow up .  Cardiology is signing off      I have spent a total of 40 minutes with patient reviewing hospital  notes , telemetry, EKGs, labs and examining patient as well as establishing an assessment and plan that was discussed with the patient.  > 50% of time was spent in direct patient  care.    Thayer Headings, Brooke Bonito., MD, [facility] 03/04/2023, 1:55 PM [id number] N. [address]  [place] Pager [phone]

## 2023-03-04 NOTE — Progress Notes (Addendum)
2 Days Post-Op Procedure(s) (LRB): XI ROBOTIC ASSISTED LAPAROSCOPIC HYSTERECTOMY AND SALPINGECTOMY (N/A)  Subjective: Patient reports tolerating PO, + flatus, and no problems voiding. Denies n/v and no incision pain. Patient s/p cardiology consult for bradycardia and hypotension. Patient currently on telemetry and plan for TTE. IF TTE wnl plan for discharge to home today. F/u with cardiology in 1 week.   Objective: I have reviewed patient's vital signs, intake and output, medications, and labs.  General: alert, cooperative, no distress, and morbidly obese Resp: clear to auscultation bilaterally Cardio: regular rate and rhythm and on telemetry for bradycardia, current HR 62 bpm GI: soft, non-tender; bowel sounds normal; no masses,  no organomegaly and incision: clean, dry, intact, and band-aids removed, steri-strips in place Extremities: extremities normal, atraumatic, no cyanosis or edema Vaginal Bleeding: none  Assessment: s/p Procedure(s): XI ROBOTIC ASSISTED LAPAROSCOPIC HYSTERECTOMY AND SALPINGECTOMY (N/A): stable, progressing well, and tolerating diet Bradycardia Type 2 diabetes mellitus Abnormal uterine bleeding Body mass index 40+ - severely obese Anemia Chronic pain Hypercholesterolemia Obstructive sleep apnea syndrome Vitamin D deficiency Tobacco use  Plan: Encourage ambulation Discontinue IV fluids Discharge home S/p cardiology consult for persistent bradycardia. Currently on telemetry and plan for TTE. If TTE wnl plan for discharge to home today. F/u with cardiology in 1 week . F/u for post-op visit in 2 weeks    LOS: 2 days    Josefine Class, MD 03/04/2023, 12:37 PM   Addendum 2:48 PM Discussed with cardiology attending that pt. HR now normal and persistent bradycardia likely 2/2 anesthesia and has now resolved. No TTE or cardiology f/u needed. TTE cancelled. Patient stable for discharge.  Dr. Bing Matter

## 2023-03-04 NOTE — Progress Notes (Signed)
Mobility Specialist - Progress Note   03/04/23 1245  Mobility  Activity Ambulated independently in hallway  Level of Assistance Independent  Assistive Device None  Distance Ambulated (ft) 500 ft  Activity Response Tolerated well  Mobility Referral Yes  $Mobility charge 1 Mobility   Pt received EOB and agreeable to mobility. No complaints during session. Pt to EOB after session with all needs met.    [facility]

## 2023-03-04 NOTE — Discharge Summary (Signed)
GYN Discharge Summary     Patient Name: Amy Mullins DOB: 1960-12-15 MRN: [id number]  Date of admission: 03/02/2023  Date of discharge: 03/04/2023  Admitting diagnosis: Status post hysterectomy [Z90.710] Secondary diagnosis:  Principal Problem:   Status post hysterectomy  Additional problems:  Menorrhagia Delayed menopause Uterine adenomyosis Fibroid uterus Type 2 diabetes mellitus Abnormal uterine bleeding Body mass index 40+ - severely obese Anemia Chronic pain Hypercholesterolemia Obstructive sleep apnea syndrome Vitamin D deficiency Tobacco use Copious pelvic adhesions Bilateral hydrosalpinx Bradycardia     Discharge diagnosis:   Status post hysterectomy [Z90.710]                                                                                              Complications: None  Hospital course:  Patient admitted for extended stay recover on 08/02/2023 after XI ROBOTIC ASSISTED LAPAROSCOPIC HYSTERECTOMY AND SALPINGECTOMY. Post-op course complicated by persistent bradycardia and patient s/p cardiology consult. Patient discharged on POD#2.  Physical exam  Vitals:   03/03/23 0954 03/03/23 1323 03/03/23 2141 03/04/23 0551  BP: (!) 97/57 (!) 110/55 (!) 106/55 (!) 118/58  Pulse: (!) 55 (!) 56 (!) 50 (!) 57  Resp: 18 18 18 17   Temp: 97.7 F (36.5 C)  97.6 F (36.4 C) 97.7 F (36.5 C)  TempSrc: Oral  Oral Oral  SpO2: 100% 100% 100% 99%  Weight:      Height:       General: alert, cooperative, no distress, and morbidly obese Resp: clear to auscultation bilaterally Cardio: regular rate and rhythm and on telemetry for bradycardia, current HR 62 bpm GI: soft, non-tender; bowel sounds normal; no masses,  no organomegaly and incision: clean, dry, intact, and band-aids removed, steri-strips in place Extremities: extremities normal, atraumatic, no cyanosis or edema Vaginal Bleeding: none  Labs: Lab Results  Component Value Date   WBC 9.7 03/04/2023   HGB 8.3 (L)  03/04/2023   HCT 28.3 (L) 03/04/2023   MCV 69.5 (L) 03/04/2023   PLT 315 03/04/2023      Latest Ref Rng & Units 03/03/2023    4:13 PM  CMP  Glucose 70 - 99 mg/dL 224   BUN 8 - 23 mg/dL 13   Creatinine 0.44 - 1.00 mg/dL 0.73   Sodium 135 - 145 mmol/L 135   Potassium 3.5 - 5.1 mmol/L 4.2   Chloride 98 - 111 mmol/L 103   CO2 22 - 32 mmol/L 23   Calcium 8.9 - 10.3 mg/dL 8.5     Discharge instruction: Hysterectomy Care After Instructions  After visit meds:  Allergies as of 03/04/2023   No Known Allergies      Medication List     STOP taking these medications    gabapentin 300 MG capsule Commonly known as: NEURONTIN   hydrOXYzine 10 MG tablet Commonly known as: ATARAX   medroxyPROGESTERone 10 MG tablet Commonly known as: PROVERA   norethindrone 0.35 MG tablet Commonly known as: MICRONOR   traZODone 50 MG tablet Commonly known as: DESYREL       TAKE these medications    atorvastatin 20 MG tablet Commonly known as: LIPITOR  Take 20 mg by mouth daily.   docusate sodium 100 MG capsule Commonly known as: Colace Take 1 capsule (100 mg total) by mouth 2 (two) times daily for 10 days.   ferrous sulfate 324 MG Tbec Take 324 mg by mouth daily.   HYDROcodone-acetaminophen 10-325 MG tablet Commonly known as: Norco Take 1 tablet by mouth every 4 (four) hours as needed for up to 5 days. What changed:  when to take this reasons to take this   ibuprofen 600 MG tablet Commonly known as: ADVIL Take 1 tablet (600 mg total) by mouth every 6 (six) hours as needed for up to 10 days.   metFORMIN 500 MG tablet Commonly known as: GLUCOPHAGE Take 500 mg by mouth 2 (two) times daily with a meal.   Rybelsus 7 MG Tabs Generic drug: Semaglutide Take 7 mg by mouth daily.   simethicone 80 MG chewable tablet Commonly known as: Gas-X Chew 1 tablet (80 mg total) by mouth every 6 (six) hours as needed for up to 10 days for flatulence.        Diet:  heart  healthy  Activity: Advance as tolerated. Pelvic rest for 6 weeks.   Outpatient follow up:2 weeks   Follow up Appt: No future appointments.  Follow up Visit:No follow-ups on file.    03/04/2023 Josefine Class, MD

## 2023-03-04 NOTE — Progress Notes (Signed)
Discharge instructions discussed with patient, verbalized agreement and understanding 

## 2023-03-04 NOTE — TOC CM/SW Note (Signed)
  Transition of Care [facility]) Screening Note   Patient Details  Name: Amy Mullins Date of Birth: 04-Apr-1961   Transition of Care [facility]) CM/SW Contact:    Lennart Pall, LCSW Phone Number: 03/04/2023, 2:18 PM    Transition of Care Department [facility]) has reviewed patient and no TOC needs have been identified at this time. We will continue to monitor patient advancement through interdisciplinary progression rounds. If new patient transition needs arise, please place a TOC consult.

## 2023-03-04 NOTE — Progress Notes (Signed)
Mobility Specialist - Progress Note   03/04/23 0935  Mobility  Activity Ambulated independently in hallway;Ambulated with assistance to bathroom  Level of Assistance Independent  Assistive Device None  Distance Ambulated (ft) 500 ft  Activity Response Tolerated well  Mobility Referral Yes  $Mobility charge 1 Mobility   Pt received in room standing and agreeable to mobility. C/o feeling dizzy during ambulation. Suggested pt take a seated rest break, pt politely declined. No other complaints during session. Pt to EOB after session with all needs met.    [facility]

## 2023-06-25 ENCOUNTER — Encounter (HOSPITAL_COMMUNITY): Payer: Self-pay

## 2023-06-25 ENCOUNTER — Emergency Department (HOSPITAL_COMMUNITY): Payer: Medicare HMO

## 2023-06-25 ENCOUNTER — Other Ambulatory Visit: Payer: Self-pay

## 2023-06-25 ENCOUNTER — Emergency Department (HOSPITAL_COMMUNITY)
Admission: EM | Admit: 2023-06-25 | Discharge: 2023-06-26 | Disposition: A | Discharge status: Home / Self Care | Payer: Medicare HMO | Attending: Emergency Medicine | Admitting: Emergency Medicine

## 2023-06-25 DIAGNOSIS — R079 Chest pain, unspecified: Secondary | ICD-10-CM | POA: Diagnosis present

## 2023-06-25 DIAGNOSIS — R0789 Other chest pain: Secondary | ICD-10-CM | POA: Diagnosis not present

## 2023-06-25 DIAGNOSIS — M25511 Pain in right shoulder: Secondary | ICD-10-CM | POA: Diagnosis not present

## 2023-06-25 LAB — TROPONIN I (HIGH SENSITIVITY)
Troponin I (High Sensitivity): 5 ng/L (ref <18)
Troponin I (High Sensitivity): 3 ng/L (ref <18)

## 2023-06-25 LAB — BASIC METABOLIC PANEL
Anion gap: 12 (ref 5–15)
BUN: 8 mg/dL (ref 8–23)
CO2: 23 mmol/L (ref 22–32)
Calcium: 9.4 mg/dL (ref 8.9–10.3)
Chloride: 100 mmol/L (ref 98–111)
Creatinine, Ser: 0.72 mg/dL (ref 0.44–1.00)
GFR, Estimated: >60 mL/min (ref >60)
Glucose, Bld: 153 mg/dL — ABNORMAL HIGH (ref 70–99)
Potassium: 3.7 mmol/L (ref 3.5–5.1)
Sodium: 135 mmol/L (ref 135–145)

## 2023-06-25 LAB — CBC
HCT: 37 % (ref 36.0–46.0)
Hemoglobin: 11.2 g/dL — ABNORMAL LOW (ref 12.0–15.0)
MCH: 21.6 pg — ABNORMAL LOW (ref 26.0–34.0)
MCHC: 30.3 g/dL (ref 30.0–36.0)
MCV: 71.4 fL — ABNORMAL LOW (ref 80.0–100.0)
Platelets: 357 10*3/uL (ref 150–400)
RBC: 5.18 MIL/uL — ABNORMAL HIGH (ref 3.87–5.11)
RDW: 16.2 % — ABNORMAL HIGH (ref 11.5–15.5)
WBC: 8.3 10*3/uL (ref 4.0–10.5)
nRBC: 0 % (ref 0.0–0.2)

## 2023-06-25 LAB — HEPATIC FUNCTION PANEL
ALT: 16 U/L (ref 0–44)
AST: 16 U/L (ref 15–41)
Albumin: 3.2 g/dL — ABNORMAL LOW (ref 3.5–5.0)
Alkaline Phosphatase: 64 U/L (ref 38–126)
Bilirubin, Direct: <0.1 mg/dL (ref 0.0–0.2)
Total Bilirubin: 0.6 mg/dL (ref 0.3–1.2)
Total Protein: 7 g/dL (ref 6.5–8.1)

## 2023-06-25 LAB — D-DIMER, QUANTITATIVE: D-Dimer, Quant: 0.63 ug/mL-FEU — ABNORMAL HIGH (ref 0.00–0.50)

## 2023-06-25 MED ORDER — CYCLOBENZAPRINE HCL 5 MG PO TABS: 5.0000 mg | ORAL_TABLET | Freq: Three times a day (TID) | ORAL | 0 refills | Status: DC | PRN

## 2023-06-25 MED ORDER — CYCLOBENZAPRINE HCL 10 MG PO TABS
5.0000 mg | ORAL_TABLET | Freq: Once | ORAL | Status: AC
  Administered 2023-06-25: 5 mg via ORAL
  Filled 2023-06-25: qty 1

## 2023-06-25 MED ORDER — KETOROLAC TROMETHAMINE 15 MG/ML IJ SOLN
15.0000 mg | Freq: Once | INTRAMUSCULAR | Status: AC
  Administered 2023-06-25: 15 mg via INTRAVENOUS
  Filled 2023-06-25: qty 1

## 2023-06-25 NOTE — ED Provider Notes (Signed)
[place] EMERGENCY DEPARTMENT AT [facility] Provider Note   CSN: [id number] Arrival date & time: 06/25/23  1821     History  Chief Complaint  Patient presents with   Chest Pain    Amy Mullins is a 62 y.o. female, history of cervical cord syndrome, obesity, sciatica, DVT, who presents to the ED secondary to right arm pain, and right chest pain has been going on for the last 3 weeks.  She states that sharp and stabbing, and worse with certain motions.  She states that it is constant, and very painful in nature.  Denies any decreased strength.  States that it is just very acutely bothersome, denies any recent trauma.  Denies any shortness of breath, nausea, vomiting.     Home Medications Prior to Admission medications   Medication Sig Start Date End Date Taking? Authorizing Provider  atorvastatin (LIPITOR) 20 MG tablet Take 20 mg by mouth daily.   Yes [provider]  cyclobenzaprine (FLEXERIL) 5 MG tablet Take 1 tablet (5 mg total) by mouth 3 (three) times daily as needed. 06/25/23  Yes Donald Jacque L, PA  HYDROcodone-acetaminophen (NORCO) 10-325 MG tablet Take 1 tablet by mouth every 6 (six) hours as needed for moderate pain.   Yes [provider]  metFORMIN (GLUCOPHAGE) 500 MG tablet Take 500 mg by mouth 2 (two) times daily with a meal.   Yes [provider]  Semaglutide (RYBELSUS) 7 MG TABS Take 7 mg by mouth daily.   Yes [provider]  ferrous sulfate 324 MG TBEC Take 324 mg by mouth daily. Patient not taking: Reported on 06/25/2023    [provider]      Allergies    Patient has no known allergies.    Review of Systems   Review of Systems  Respiratory:  Negative for shortness of breath.   Cardiovascular:  Positive for chest pain.    Physical Exam Updated Vital Signs BP 135/76   Pulse 66   Temp 98.5 F (36.9 C) (Oral)   Resp 12   Ht 5\' 7"  (1.702 m)   Wt (!) 152 kg   SpO2 99%   BMI 52.48 kg/m  Physical  Exam Vitals and nursing note reviewed.  Constitutional:      General: She is not in acute distress.    Appearance: She is well-developed.  HENT:     Head: Normocephalic and atraumatic.  Eyes:     Conjunctiva/sclera: Conjunctivae normal.  Cardiovascular:     Rate and Rhythm: Normal rate and regular rhythm.     Heart sounds: No murmur heard. Pulmonary:     Effort: Pulmonary effort is normal. No respiratory distress.     Breath sounds: Normal breath sounds.  Abdominal:     Palpations: Abdomen is soft.     Tenderness: There is no abdominal tenderness.  Musculoskeletal:        General: No swelling.     Cervical back: Neck supple.     Comments: Tenderness to palpation of right chest wall as well as right upper arm, going to the elbow.  No evident evidence of rash.  5/5 strength of bilateral upper extremities.  Skin:    General: Skin is warm and dry.     Capillary Refill: Capillary refill takes less than 2 seconds.  Neurological:     Mental Status: She is alert.  Psychiatric:        Mood and Affect: Mood normal.     ED Results /  Procedures / Treatments   Labs (all labs ordered are listed, but only abnormal results are displayed) Labs Reviewed  BASIC METABOLIC PANEL - Abnormal; Notable for the following components:      Result Value   Glucose, Bld 153 (*)    All other components within normal limits  CBC - Abnormal; Notable for the following components:   RBC 5.18 (*)    Hemoglobin 11.2 (*)    MCV 71.4 (*)    MCH 21.6 (*)    RDW 16.2 (*)    All other components within normal limits  D-DIMER, QUANTITATIVE - Abnormal; Notable for the following components:   D-Dimer, Quant 0.63 (*)    All other components within normal limits  HEPATIC FUNCTION PANEL - Abnormal; Notable for the following components:   Albumin 3.2 (*)    All other components within normal limits  TROPONIN I (HIGH SENSITIVITY)  TROPONIN I (HIGH SENSITIVITY)    EKG None  Radiology DG Shoulder  Right  Result Date: 06/25/2023 CLINICAL DATA:  shoulder pain, no know injury EXAM: RIGHT SHOULDER - 2+ VIEW COMPARISON:  None Available. FINDINGS: There is no evidence of fracture or dislocation. Mild degenerative changes. Soft tissues are unremarkable. IMPRESSION: Negative. Electronically Signed   By: Tish Frederickson M.D.   On: 06/25/2023 21:23   DG Chest 2 View  Result Date: 06/25/2023 CLINICAL DATA:  Chest pain EXAM: CHEST - 2 VIEW COMPARISON:  06/13/2019 FINDINGS: Hardware in the cervical spine. No acute airspace disease or effusion. Normal cardiac size. No pneumothorax. Multilevel degenerative changes IMPRESSION: No active cardiopulmonary disease. Electronically Signed   By: Jasmine Pang M.D.   On: 06/25/2023 19:32    Procedures Procedures    Medications Ordered in ED Medications  cyclobenzaprine (FLEXERIL) tablet 5 mg (5 mg Oral Given 06/25/23 2144)  ketorolac (TORADOL) 15 MG/ML injection 15 mg (15 mg Intravenous Given 06/25/23 2145)    ED Course/ Medical Decision Making/ A&P             HEART Score: 3                Medical Decision Making Patient is a 62 year old female, here for chest pain radiating to right arm, has been going on for the last 3 weeks.  Worse with range of motion.  She does have tenderness to palpation of the chest wall as well as the right arm.  No evidence of rash, strength is good and she has no cervical spine tenderness.  She does have a history of cervical cord syndrome, however no weakness.  I discussed this with Dr. Earlene Plater knee evaluated no need for MRI at this time.  Will obtain troponins, chest x-ray for further evaluation.  PERC positive, will require a D-dimer  Amount and/or Complexity of Data Reviewed Labs: ordered.    Details: Troponin within normal limits, D-dimer elevated 0.63 Radiology: ordered.    Details: Chest x-ray clear ECG/medicine tests:  Decision-making details documented in ED Course. Discussion of management or test interpretation with  external provider(s): Discussed with patient, D-dimer elevated, will require CTA, based off age stratification, and this was ordered.  Handed off to [employer], [place], for dispo after CTA.  I believe that her chest pain if her CTA is clear, is likely secondary to a rotator cuff tear, or muscle strain.  We placed her in a shoulder sling, to help with support.  I sent her some Flexeril.  He will dispo after CTA.  Risk Prescription drug management.  Final Clinical Impression(s) / ED Diagnoses Final diagnoses:  Chest pain, unspecified type  Acute pain of right shoulder    Rx / DC Orders ED Discharge Orders          Ordered    cyclobenzaprine (FLEXERIL) 5 MG tablet  3 times daily PRN        06/25/23 2229              Kyrel Leighton, Harley [place], PA 06/26/23 0003    Laurence Spates, MD 06/26/23 [phone]

## 2023-06-25 NOTE — ED Triage Notes (Signed)
Pt c/o right arm pain that radiates up to right shoulder and to right side of [id number]. Pt denies N/V, SOB

## 2023-06-25 NOTE — Discharge Instructions (Addendum)
Please follow-up with your primary care doctor, return to the ER if you have any weakness of your bilateral upper extremities, any kind of new chest pain, or severe shortness of breath.  I prescribed you most relaxer, to help with your pain, as well as you should take your chronic pain medications.  Follow-up with your primary care doctor, for further evaluation.  I am suspicious that you may have a muscle tear, that needs to be seen by orthopedics.  Use the shoulder sling for comfort.

## 2023-06-26 ENCOUNTER — Emergency Department (HOSPITAL_COMMUNITY): Payer: Medicare HMO

## 2023-06-26 DIAGNOSIS — R0789 Other chest pain: Secondary | ICD-10-CM | POA: Diagnosis not present

## 2023-06-26 MED ORDER — IOHEXOL 350 MG/ML SOLN
75.0000 mL | Freq: Once | INTRAVENOUS | Status: AC | PRN
  Administered 2023-06-26: 75 mL via INTRAVENOUS

## 2023-10-19 ENCOUNTER — Other Ambulatory Visit: Payer: Self-pay

## 2023-10-19 ENCOUNTER — Emergency Department (HOSPITAL_COMMUNITY)
Admission: EM | Admit: 2023-10-19 | Discharge: 2023-10-19 | Disposition: A | Discharge status: Home / Self Care | Payer: Medicare HMO | Attending: Emergency Medicine | Admitting: Emergency Medicine

## 2023-10-19 ENCOUNTER — Encounter (HOSPITAL_COMMUNITY): Payer: Self-pay

## 2023-10-19 ENCOUNTER — Emergency Department (HOSPITAL_COMMUNITY): Payer: Medicare HMO

## 2023-10-19 DIAGNOSIS — E119 Type 2 diabetes mellitus without complications: Secondary | ICD-10-CM | POA: Diagnosis not present

## 2023-10-19 DIAGNOSIS — Y92000 Kitchen of unspecified non-institutional (private) residence as  the place of occurrence of the external cause: Secondary | ICD-10-CM | POA: Insufficient documentation

## 2023-10-19 DIAGNOSIS — Z7984 Long term (current) use of oral hypoglycemic drugs: Secondary | ICD-10-CM | POA: Insufficient documentation

## 2023-10-19 DIAGNOSIS — F17210 Nicotine dependence, cigarettes, uncomplicated: Secondary | ICD-10-CM | POA: Insufficient documentation

## 2023-10-19 DIAGNOSIS — W010XXA Fall on same level from slipping, tripping and stumbling without subsequent striking against object, initial encounter: Secondary | ICD-10-CM | POA: Insufficient documentation

## 2023-10-19 DIAGNOSIS — S83411A Sprain of medial collateral ligament of right knee, initial encounter: Secondary | ICD-10-CM | POA: Insufficient documentation

## 2023-10-19 DIAGNOSIS — W19XXXA Unspecified fall, initial encounter: Secondary | ICD-10-CM

## 2023-10-19 DIAGNOSIS — S8991XA Unspecified injury of right lower leg, initial encounter: Secondary | ICD-10-CM | POA: Diagnosis present

## 2023-10-19 DIAGNOSIS — M25561 Pain in right knee: Secondary | ICD-10-CM

## 2023-10-19 MED ORDER — KETOROLAC TROMETHAMINE 60 MG/2ML IM SOLN
30.0000 mg | Freq: Once | INTRAMUSCULAR | Status: AC
Start: 2023-10-19 — End: 2023-10-19
  Administered 2023-10-19: 30 mg via INTRAMUSCULAR
  Filled 2023-10-19: qty 2

## 2023-10-19 MED ORDER — IBUPROFEN 800 MG PO TABS
800.0000 mg | ORAL_TABLET | Freq: Three times a day (TID) | ORAL | 0 refills | Status: AC
Start: 2023-10-19 — End: ?

## 2023-10-19 MED ORDER — OXYCODONE HCL 5 MG PO TABS
5.0000 mg | ORAL_TABLET | Freq: Once | ORAL | Status: AC
  Administered 2023-10-19: 5 mg via ORAL
  Filled 2023-10-19: qty 1

## 2023-10-19 MED ORDER — HYDROCODONE-ACETAMINOPHEN 5-325 MG PO TABS: 2.0000 | ORAL_TABLET | ORAL | 0 refills | Status: DC | PRN

## 2023-10-19 MED ORDER — ACETAMINOPHEN 500 MG PO TABS
1000.0000 mg | ORAL_TABLET | Freq: Once | ORAL | Status: AC
  Administered 2023-10-19: 1000 mg via ORAL
  Filled 2023-10-19: qty 2

## 2023-10-19 NOTE — ED Notes (Signed)
Ortho team called for crutches.

## 2023-10-19 NOTE — ED Notes (Signed)
Patient used bedpan with assistance. Patient unable to ambulate due to not being able to bear weight or move the right leg.

## 2023-10-19 NOTE — Discharge Instructions (Addendum)
It was a pleasure caring for you today in the emergency department.  Please call orthopedics this afternoon or first thing tomorrow to arrange follow-up appointment in 5-7 days  Please return to the emergency department for any worsening or worrisome symptoms.

## 2023-10-19 NOTE — ED Provider Notes (Signed)
[place] EMERGENCY DEPARTMENT AT [facility] Provider Note  CSN: [id number] Arrival date & time: 10/19/23 7322  Chief Complaint(s) Fall and Knee Pain  HPI Amy Mullins is a 62 y.o. female with past medical history as below, significant for ambulates with a cane/walker, DM, HLD, obesity, right knee DJD who presents to the ED with complaint of right knee pain, fall  Patient reports she slipped in the kitchen last night on a wet spot, felt her leg hyperextended she had pain to the medial aspect of her right knee.  Difficulty walking secondary to the pain.  No numbness or tingling distal to the knee.  No injury to ankle or hip ipsilateral.  No other injuries.  No injury or LOC.  No medication prior to arrival.  Uses a walker/cane at baseline  Past Medical History Past Medical History:  Diagnosis Date   Abnormal vaginal bleeding    Follows w/ Dr. Damaris Hippo.   Ambulates with cane    Anemia    hx of anemia, most recent Hgb 7.1 on 09/28/22   Chronic back pain    Current smoker 2024   Diabetes mellitus without complication (HCC)    Follows w/ Dr. Haroldine Laws @ [facility].   DVT (deep venous thrombosis) (HCC) 06/11/2019   right leg   HLD (hyperlipidemia)    Follows with Dr. Haroldine Laws @ [facility].   Obesity    Posterior vitreous detachment of right eye    Right knee DJD    Sciatica of left side    Seizures (HCC)    AS of 02/23/23, pt stated that she had seizures in the 1980's  and was on medication. She states that she stopped taking medication and has not had a seizure since the 1980's.   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 Authorizing Provider  HYDROcodone-acetaminophen (NORCO) 5-325 MG tablet Take 2 tablets by mouth every 4 (four) hours as needed for moderate pain (pain score 4-6). 10/19/23  Yes Tanda Rockers A, DO  ibuprofen (ADVIL) 800 MG tablet Take 1 tablet (800 mg total) by mouth 3 (three) times daily. 10/19/23  Yes Tanda Rockers A, DO  atorvastatin (LIPITOR) 20 MG tablet Take 20 mg by mouth daily.    [provider]  cyclobenzaprine (FLEXERIL) 5 MG tablet Take 1 tablet (5 mg total) by mouth 3 (three) times daily as needed. 06/25/23   Small, Brooke L, PA  ferrous sulfate 324 MG TBEC Take 324 mg by mouth daily. Patient not taking: Reported on 06/25/2023    [provider]  HYDROcodone-acetaminophen (NORCO) 10-325 MG tablet Take 1 tablet by mouth every 6 (six) hours as needed for moderate pain.    [provider]  metFORMIN (GLUCOPHAGE) 500 MG tablet Take 500 mg by mouth 2 (two) times daily with a meal.    [provider]  Semaglutide (RYBELSUS) 7 MG TABS Take 7 mg by mouth daily.    [provider]                                                                                                                                    Past Surgical History Past Surgical History:  Procedure Laterality Date   ABDOMINAL HYSTERECTOMY     ANTERIOR CERVICAL DECOMP/DISCECTOMY FUSION N/A 06/04/2019   Procedure: ANTERIOR CERVICAL DECOMPRESSION/DISCECTOMY FUSION 1 LEVEL;  Surgeon: Jadene Pierini, MD;  Location: [facility] OR;  Service: Neurosurgery;  Laterality: N/A;  anterior    COLONOSCOPY WITH  ESOPHAGOGASTRODUODENOSCOPY (EGD)     unsure of date   ENDOMETRIAL BIOPSY  11/18/2022   benign   ROBOTIC ASSISTED LAPAROSCOPIC HYSTERECTOMY AND SALPINGECTOMY N/A 03/02/2023   Procedure: XI ROBOTIC ASSISTED LAPAROSCOPIC HYSTERECTOMY AND SALPINGECTOMY;  Surgeon: Jackie Plum, MD;  Location: [facility] ORS;  Service: Gynecology;  Laterality: N/A;   TRIGGER FINGER RELEASE Right    Family History Family History  Problem Relation Age of Onset   Prostate cancer Father     Social History Social History   Tobacco Use   Smoking status: Every Day    Current packs/day: 0.25    Average packs/day: 0.3 packs/day for 47.0 years (11.8 ttl pk-yrs)    Types: Cigarettes   Smokeless tobacco: Never  Vaping Use   Vaping status: Never Used  Substance Use Topics   Alcohol use: Never   Drug use: Not Currently    Types: "Crack" cocaine    Comment: Hasn't had any since the 1990's.   Allergies Patient has no known allergies.  Review of Systems Review of Systems  Constitutional:  Negative for chills and fever.  Gastrointestinal:  Negative for abdominal pain and nausea.  Genitourinary:  Negative for dysuria.  Musculoskeletal:  Positive for arthralgias and gait problem.  Neurological:  Negative for syncope and headaches.  All other systems reviewed and are negative.   Physical Exam Vital Signs  I have reviewed the triage vital signs BP 109/66   Pulse 74   Temp 97.8 F (36.6 C)   Resp 16   Ht 5\' 7"  (1.702 m)   Wt (!) 156.5 kg   SpO2 96%   BMI 54.03 kg/m  Physical Exam Vitals and nursing note reviewed.  Constitutional:      General: She is not in acute distress.    Appearance: Normal appearance. She is well-developed. She is obese. She is not ill-appearing.  HENT:     Head: Normocephalic and atraumatic.     Right Ear: External ear normal.     Left Ear: External ear normal.     Nose: Nose normal.     Mouth/Throat:     Mouth: Mucous membranes are moist.  Eyes:     General: No scleral  icterus.       Right eye: No discharge.        Left eye: No discharge.  Cardiovascular:     Rate and Rhythm: Normal rate.  Pulmonary:     Effort: Pulmonary effort is normal. No respiratory distress.     Breath sounds: No stridor.  Abdominal:     General: Abdomen is flat. There is no distension.     Tenderness: There is no guarding.  Musculoskeletal:        General: No deformity.     Cervical back: No rigidity.       Legs:     Comments: Pain and bruising to anterior medial aspect of right knee.  Quadricep, patella and Achilles tendon appear intact on the right side.  LE NVI.  Limited range of motion to right knee secondary to pain.  No pain on palpation to right ankle or right hip  Skin:    General: Skin is warm and dry.     Coloration: Skin is not cyanotic, jaundiced or pale.  Neurological:     Mental Status: She is alert and oriented to person, place, and time.     GCS: GCS eye subscore is 4. GCS verbal subscore is 5. GCS motor subscore is 6.  Psychiatric:        Speech: Speech normal.        Behavior: Behavior normal. Behavior is cooperative.     ED Results and Treatments Labs (all labs ordered are listed, but only abnormal results are displayed) Labs Reviewed - No data to display                                                                                                                        Radiology DG Knee Complete 4 Views Right  Result Date: 10/19/2023 CLINICAL DATA:  fall, hyperflexion, medial/anterior pain. EXAM: RIGHT KNEE - COMPLETE 4+ VIEW COMPARISON:  06/03/2019. FINDINGS: No acute fracture or dislocation. No aggressive osseous lesion. There are degenerative changes of the knee joint in the form of mildly reduced medial tibio-femoral compartment joint space, tibial spiking and tricompartmental osteophytosis. No knee effusion or focal soft tissue swelling. No radiopaque foreign bodies. IMPRESSION: *No acute osseous abnormality of the right knee joint. *Mild  degenerative changes of the right knee joint. Electronically Signed   By: Jules Schick M.D.   On: 10/19/2023 13:13    Pertinent labs & imaging results that were available during my care of the patient were reviewed by me and considered in my medical decision making (see MDM for details).  Medications Ordered in ED Medications  oxyCODONE (Oxy IR/ROXICODONE) immediate release tablet 5 mg (5 mg Oral Given 10/19/23 1034)  acetaminophen (TYLENOL) tablet 1,000 mg (1,000 mg Oral Given 10/19/23 1034)  oxyCODONE (Oxy IR/ROXICODONE) immediate release tablet 5 mg (5 mg Oral Given 10/19/23 1339)  ketorolac (TORADOL) injection 30 mg (30 mg Intramuscular Given 10/19/23 1339)  Procedures Procedures  (including critical care time)  Medical Decision Making / ED Course    Medical Decision Making:    Lakya Simental is a 62 y.o. female with past medical history as below, significant for ambulates with a cane/walker, DM, HLD, obesity, right knee DJD who presents to the ED with complaint of right knee pain, fall. The complaint involves an extensive differential diagnosis and also carries with it a high risk of complications and morbidity.  Serious etiology was considered. Ddx includes but is not limited to: Ligamentous injury, fracture, dislocation, sprain, strain, etc.  Complete initial physical exam performed, notably the patient  was in acute distress, lying on stretcher.    Reviewed and confirmed nursing documentation for past medical history, family history, social history.  Vital signs reviewed.    Clinical Course as of 10/19/23 1432  Mon Oct 19, 2023  1330 Knee XR stable, no fx [SG]    Clinical Course User Index [SG] Tanda Rockers A, DO     Significant pain to right knee and limited range of motion.  Difficult to complete provocative testing secondary to pain.   Tendons appear intact.  Will get x-ray, analgesia.  I am suspicious for possible ligamentous injury   Denies any shoulder pain    X-ray looks okay.  On exam concern for possible MCL sprain.  Quadricep and patella tendons are intact.  She uses a walker at home.  Ace wrap was applied as has habitus prohibits use of knee immobilizer or knee sleeve.  Analgesia given, pain improved.  LE NVI.  Plan for discharge home and follow-up orthopedics for recheck in the next 5 to 7 days once the swelling has subsided.  Advised her nonweightbearing at home, she will use her walker.  She takes Norco chronically, given refill as increased pain during acute phase of injury    The patient improved significantly and was discharged in stable condition. Detailed discussions were had with the patient regarding current findings, and need for close f/u with PCP or on call doctor. The patient has been instructed to return immediately if the symptoms worsen in any way for re-evaluation. Patient verbalized understanding and is in agreement with current care plan. All questions answered prior to discharge.             Additional history obtained: -Additional history obtained from na -External records from outside source obtained and reviewed including: Chart review including previous notes, labs, imaging, consultation notes including  PDMP Home medications   Lab Tests: na  EKG   EKG Interpretation Date/Time:    Ventricular Rate:    PR Interval:    QRS Duration:    QT Interval:    QTC Calculation:   R Axis:      Text Interpretation:           Imaging Studies ordered: I ordered imaging studies including knee xr  I independently visualized the following imaging with scope of interpretation limited to determining acute life threatening conditions related to emergency care; findings noted above I independently visualized and interpreted imaging. I agree with the radiologist  interpretation   Medicines ordered and prescription drug management: Meds ordered this encounter  Medications   oxyCODONE (Oxy IR/ROXICODONE) immediate release tablet 5 mg   acetaminophen (TYLENOL) tablet 1,000 mg   oxyCODONE (Oxy IR/ROXICODONE) immediate release tablet 5 mg   ketorolac (TORADOL) injection 30 mg   ibuprofen (ADVIL) 800 MG tablet    Sig: Take 1 tablet (800 mg total) by mouth  3 (three) times daily.    Dispense:  21 tablet    Refill:  0   HYDROcodone-acetaminophen (NORCO) 5-325 MG tablet    Sig: Take 2 tablets by mouth every 4 (four) hours as needed for moderate pain (pain score 4-6).    Dispense:  10 tablet    Refill:  0    -I have reviewed the patients home medicines and have made adjustments as needed   Consultations Obtained: na   Cardiac Monitoring: Continuous pulse oximetry interpreted by myself, 98% on RA.    Social Determinants of Health:  Diagnosis or treatment significantly limited by social determinants of health: current smoker and obesity   Reevaluation: After the interventions noted above, I reevaluated the patient and found that they have improved  Co morbidities that complicate the patient evaluation  Past Medical History:  Diagnosis Date   Abnormal vaginal bleeding    Follows w/ Dr. Damaris Hippo.   Ambulates with cane    Anemia    hx of anemia, most recent Hgb 7.1 on 09/28/22   Chronic back pain    Current smoker 2024   Diabetes mellitus without complication (HCC)    Follows w/ Dr. Haroldine Laws @ [facility].   DVT (deep venous thrombosis) (HCC) 06/11/2019   right leg   HLD (hyperlipidemia)    Follows with Dr. Haroldine Laws @ [facility].   Obesity    Posterior vitreous detachment of right eye    Right knee DJD    Sciatica of left side    Seizures (HCC)    AS of 02/23/23, pt stated that she had seizures in the 1980's  and was on medication. She states that she stopped taking medication and has not had a  seizure since the 1980's.   Uses walker    Wears dentures    Wears glasses       Dispostion: Disposition decision including need for hospitalization was considered, and patient discharged from emergency department.    Final Clinical Impression(s) / ED Diagnoses Final diagnoses:  Sprain of medial collateral ligament of right knee, initial encounter  Fall, initial encounter  Acute pain of right knee        Sloan Leiter, DO 10/19/23 1432

## 2023-10-19 NOTE — ED Triage Notes (Signed)
Pt arrived to ED via [facility]. Pt slipped on water in her kitchen and fell yesterday. Right knee pain has been present sent. Pt stated that she felt like her knee bent backwards. Pt also experiencing right shoulder pain. Pt unable to put weight on right leg.   EMS VS 130/86 86 HR 18 RR 94% 197 CBG

## 2023-10-19 NOTE — ED Notes (Signed)
Patient transported to X-ray 

## 2024-02-29 ENCOUNTER — Encounter (HOSPITAL_COMMUNITY): Payer: Self-pay

## 2024-02-29 ENCOUNTER — Ambulatory Visit (HOSPITAL_COMMUNITY): Admission: EM | Admit: 2024-02-29 | Discharge: 2024-02-29 | Disposition: A | Discharge status: Home / Self Care

## 2024-02-29 DIAGNOSIS — R42 Dizziness and giddiness: Secondary | ICD-10-CM

## 2024-02-29 DIAGNOSIS — R519 Headache, unspecified: Secondary | ICD-10-CM | POA: Diagnosis not present

## 2024-02-29 LAB — POCT FASTING CBG KUC MANUAL ENTRY: POCT Glucose (KUC): 106 mg/dL — AB (ref 70–99)

## 2024-02-29 MED ORDER — KETOROLAC TROMETHAMINE 60 MG/2ML IM SOLN: 60.0000 mg | Freq: Once | INTRAMUSCULAR | Status: DC

## 2024-02-29 MED ORDER — DICLOFENAC SODIUM 75 MG PO TBEC: 75.0000 mg | DELAYED_RELEASE_TABLET | Freq: Two times a day (BID) | ORAL | 0 refills | Status: AC

## 2024-02-29 MED ORDER — MECLIZINE HCL 25 MG PO TABS: 25.0000 mg | ORAL_TABLET | Freq: Three times a day (TID) | ORAL | 0 refills | Status: AC | PRN
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
## 2024-02-29 NOTE — Discharge Instructions (Addendum)
 1. Acute nonintractable headache, unspecified headache type (Primary) - ketorolac (TORADOL) injection 60 mg given in clinic for acute headache. - diclofenac (VOLTAREN) 75 MG EC tablet; Take 1 tablet (75 mg total) by mouth 2 (two) times daily.  Dispense: 30 tablet; Refill: 0  2. Vertigo - ED EKG is normal sinus rhythm with a ventricular rate of 70 bpm, no sign of STEMI - POC CBG shows current BG is 106 mg/dL - meclizine (ANTIVERT) 25 MG tablet; Take 1 tablet (25 mg total) by mouth 3 (three) times daily as needed for dizziness.  Dispense: 30 tablet; Refill: 0 -Continue monitor symptoms for any changes severity if there is any escalation of current symptoms or development of new symptoms follow-up for further evaluation and management.

## 2024-02-29 NOTE — ED Triage Notes (Signed)
 Patient here today with c/o dizziness and headache X 3 days. Patient states that she felt nausea yesterday. Patient has h/o diabetes.

## 2024-09-01 ENCOUNTER — Emergency Department (HOSPITAL_COMMUNITY)
Admission: EM | Admit: 2024-09-01 | Discharge: 2024-09-02 | Discharge status: Against Medical Advice | Attending: Emergency Medicine | Admitting: Emergency Medicine

## 2024-09-01 ENCOUNTER — Emergency Department (HOSPITAL_COMMUNITY)

## 2024-09-01 DIAGNOSIS — Z4800 Encounter for change or removal of nonsurgical wound dressing: Secondary | ICD-10-CM | POA: Diagnosis not present

## 2024-09-01 DIAGNOSIS — Z5321 Procedure and treatment not carried out due to patient leaving prior to being seen by health care provider: Secondary | ICD-10-CM | POA: Insufficient documentation

## 2024-09-01 DIAGNOSIS — E119 Type 2 diabetes mellitus without complications: Secondary | ICD-10-CM | POA: Insufficient documentation

## 2024-09-01 DIAGNOSIS — M79672 Pain in left foot: Secondary | ICD-10-CM | POA: Insufficient documentation

## 2024-09-01 LAB — BASIC METABOLIC PANEL WITH GFR
Anion gap: 11 (ref 5–15)
BUN: 13 mg/dL (ref 8–23)
CO2: 24 mmol/L (ref 22–32)
Calcium: 9.4 mg/dL (ref 8.9–10.3)
Chloride: 99 mmol/L (ref 98–111)
Creatinine, Ser: 0.76 mg/dL (ref 0.44–1.00)
GFR, Estimated: >60 mL/min (ref >60)
Glucose, Bld: 200 mg/dL — ABNORMAL HIGH (ref 70–99)
Potassium: 3.7 mmol/L (ref 3.5–5.1)
Sodium: 134 mmol/L — ABNORMAL LOW (ref 135–145)

## 2024-09-01 LAB — CBC WITH DIFFERENTIAL/PLATELET
Abs Immature Granulocytes: 0.04 K/uL (ref 0.00–0.07)
Basophils Absolute: 0.1 K/uL (ref 0.0–0.1)
Basophils Relative: 1 %
Eosinophils Absolute: 0.2 K/uL (ref 0.0–0.5)
Eosinophils Relative: 2 %
HCT: 38 % (ref 36.0–46.0)
Hemoglobin: 11.5 g/dL — ABNORMAL LOW (ref 12.0–15.0)
Immature Granulocytes: 0 %
Lymphocytes Relative: 29 %
Lymphs Abs: 2.8 K/uL (ref 0.7–4.0)
MCH: 21.9 pg — ABNORMAL LOW (ref 26.0–34.0)
MCHC: 30.3 g/dL (ref 30.0–36.0)
MCV: 72.5 fL — ABNORMAL LOW (ref 80.0–100.0)
Monocytes Absolute: 0.5 K/uL (ref 0.1–1.0)
Monocytes Relative: 5 %
Neutro Abs: 6.2 K/uL (ref 1.7–7.7)
Neutrophils Relative %: 63 %
Platelets: 366 K/uL (ref 150–400)
RBC: 5.24 MIL/uL — ABNORMAL HIGH (ref 3.87–5.11)
RDW: 14.4 % (ref 11.5–15.5)
WBC: 9.7 K/uL (ref 4.0–10.5)
nRBC: 0 % (ref 0.0–0.2)

## 2024-09-01 NOTE — ED Triage Notes (Signed)
 Pt ambulatory to triage C/O L foot pain and swelling. Reports hx DM and thinks there may be a sore on bottom of foot.

## 2024-09-01 NOTE — ED Provider Triage Note (Signed)
 Emergency Medicine Provider Triage Evaluation Note  Lizbett Garciagarcia , a 63 y.o. female  was evaluated in triage.  Pt complains of left foot pain.  Burning hurts to walk on it.  Patient is diabetic.  Review of Systems  Positive: Left foot pain Negative: Wound  Physical Exam  BP 132/67   Pulse 89   Temp (!) 97.2 F (36.2 C)   Resp 16   LMP 06/20/2019 Comment: 02-20-23 Started having a period still bleeding today 03-02-23 UA preg test negative  SpO2 94%  Gen:   Awake, no distress   Resp:  Normal effort  MSK:   Moves extremities without difficulty  Other:  No obvious wound bounding DP PT pulse  Medical Decision Making  Medically screening exam initiated at 9:05 PM.  Appropriate orders placed.  Stefany Starace was informed that the remainder of the evaluation will be completed by another provider, this initial triage assessment does not replace that evaluation, and the importance of remaining in the ED until their evaluation is complete.     Arloa Chroman, PA-C 09/01/24 [id number]

## 2024-09-01 NOTE — ED Notes (Signed)
 Patient was called 5x, and received no answer.
